# Patient Record
Sex: Female | Born: 1960
Health system: Southern US, Community
[De-identification: ages and names within clinical notes are randomized; demographics above are authoritative.]

## PROBLEM LIST (undated history)

## (undated) DIAGNOSIS — Z9109 Other allergy status, other than to drugs and biological substances: Secondary | ICD-10-CM

## (undated) DIAGNOSIS — R569 Unspecified convulsions: Secondary | ICD-10-CM

## (undated) DIAGNOSIS — Q6689 Other  specified congenital deformities of feet: Secondary | ICD-10-CM

## (undated) DIAGNOSIS — K219 Gastro-esophageal reflux disease without esophagitis: Secondary | ICD-10-CM

## (undated) DIAGNOSIS — Z860101 Personal history of adenomatous and serrated colon polyps: Secondary | ICD-10-CM

## (undated) DIAGNOSIS — E119 Type 2 diabetes mellitus without complications: Secondary | ICD-10-CM

## (undated) DIAGNOSIS — E7800 Pure hypercholesterolemia, unspecified: Secondary | ICD-10-CM

## (undated) DIAGNOSIS — T7840XA Allergy, unspecified, initial encounter: Secondary | ICD-10-CM

## (undated) DIAGNOSIS — R141 Gas pain: Secondary | ICD-10-CM

## (undated) DIAGNOSIS — R011 Cardiac murmur, unspecified: Secondary | ICD-10-CM

## (undated) DIAGNOSIS — R7303 Prediabetes: Secondary | ICD-10-CM

## (undated) DIAGNOSIS — M1990 Unspecified osteoarthritis, unspecified site: Secondary | ICD-10-CM

## (undated) DIAGNOSIS — I10 Essential (primary) hypertension: Secondary | ICD-10-CM

## (undated) DIAGNOSIS — M069 Rheumatoid arthritis, unspecified: Secondary | ICD-10-CM

## (undated) HISTORY — DX: Gastro-esophageal reflux disease without esophagitis: K21.9

## (undated) HISTORY — PX: HERNIA REPAIR: SHX51

## (undated) HISTORY — DX: Pure hypercholesterolemia, unspecified: E78.00

## (undated) HISTORY — DX: Allergy, unspecified, initial encounter: T78.40XA

## (undated) HISTORY — DX: Unspecified osteoarthritis, unspecified site: M19.90

## (undated) HISTORY — DX: Essential (primary) hypertension: I10

## (undated) HISTORY — DX: Cardiac murmur, unspecified: R01.1

## (undated) HISTORY — DX: Other allergy status, other than to drugs and biological substances: Z91.09

---
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
## 1991-10-17 HISTORY — PX: TUBAL LIGATION: SHX77

## 2004-03-04 ENCOUNTER — Ambulatory Visit: Payer: Self-pay | Admitting: Internal Medicine

## 2005-03-11 ENCOUNTER — Ambulatory Visit: Payer: Self-pay | Admitting: Internal Medicine

## 2006-04-13 ENCOUNTER — Ambulatory Visit: Payer: Self-pay | Admitting: Internal Medicine

## 2007-07-13 ENCOUNTER — Ambulatory Visit: Payer: Self-pay | Admitting: Internal Medicine

## 2008-07-16 ENCOUNTER — Ambulatory Visit: Payer: Self-pay | Admitting: Internal Medicine

## 2010-12-10 ENCOUNTER — Ambulatory Visit: Payer: Self-pay | Admitting: Internal Medicine

## 2012-04-18 ENCOUNTER — Ambulatory Visit (INDEPENDENT_AMBULATORY_CARE_PROVIDER_SITE_OTHER): Payer: 59 | Admitting: Internal Medicine

## 2012-04-18 ENCOUNTER — Encounter: Payer: Self-pay | Admitting: Internal Medicine

## 2012-04-18 ENCOUNTER — Other Ambulatory Visit (HOSPITAL_COMMUNITY)
Admission: RE | Admit: 2012-04-18 | Discharge: 2012-04-18 | Disposition: A | Discharge status: Home / Self Care | Payer: 59 | Source: Ambulatory Visit | Attending: Internal Medicine | Admitting: Internal Medicine

## 2012-04-18 VITALS — BP 126/88 | HR 79 | Temp 98.9°F | Ht 65.0 in | Wt 190.8 lb

## 2012-04-18 DIAGNOSIS — E7800 Pure hypercholesterolemia, unspecified: Secondary | ICD-10-CM

## 2012-04-18 DIAGNOSIS — K219 Gastro-esophageal reflux disease without esophagitis: Secondary | ICD-10-CM

## 2012-04-18 DIAGNOSIS — Z01419 Encounter for gynecological examination (general) (routine) without abnormal findings: Secondary | ICD-10-CM | POA: Insufficient documentation

## 2012-04-18 DIAGNOSIS — Z1151 Encounter for screening for human papillomavirus (HPV): Secondary | ICD-10-CM | POA: Insufficient documentation

## 2012-04-18 DIAGNOSIS — Z139 Encounter for screening, unspecified: Secondary | ICD-10-CM

## 2012-04-18 MED ORDER — CLOTRIMAZOLE-BETAMETHASONE 1-0.05 % EX CREA: 1.0000 "application " | TOPICAL_CREAM | Freq: Two times a day (BID) | CUTANEOUS | Status: DC

## 2012-04-18 MED ORDER — ACYCLOVIR 5 % EX OINT: TOPICAL_OINTMENT | CUTANEOUS | Status: DC

## 2012-04-18 NOTE — Assessment & Plan Note (Signed)
Controlled on Prilosec.  Follow.   

## 2012-04-18 NOTE — Assessment & Plan Note (Signed)
Low cholesterol diet and exercise.  Check lipid panel.   

## 2012-04-18 NOTE — Patient Instructions (Addendum)
It was nice seeing you today.  I am glad you have been doing well.  I want you to monitor your blood pressure for me and send in readings.  Let me know if any problems.

## 2012-04-18 NOTE — Progress Notes (Signed)
Subjective:    Patient ID: Wendy Callahan, female    DOB: 08/10/1960, 51 y.o.   MRN: [id number]  HPI 51 year old female with past history of allergies and GERD who comes in today to follow up on these issues as well as for a complete physical exam.  She states she is doing relatively well.  Had strep throat one month ago.  Symptoms have resolved.  She has a different job at [employer].  Not as flexible with her schedule.  Still walking.  Not exercising as much.  She does notice some sob with exertion.  Some intermittent chest tightness which she states may not be related to exertion.  Was questioning if it could have been stress related.  No pain currently.  Feels she is handling stress relatively well.  Periods regular.  LMP 04/13/12.  No bowel change.    Past Medical History  Diagnosis Date  . Environmental allergies   . GERD (gastroesophageal reflux disease)   . Hypercholesterolemia   . Arthritis   . Allergy   . Hypertension     Current Outpatient Prescriptions on File Prior to Visit  Medication Sig Dispense Refill  . fexofenadine (ALLEGRA) 180 MG tablet Take 180 mg by mouth daily.      Marland Kitchen omeprazole (PRILOSEC) 20 MG capsule Take 20 mg by mouth daily.        Review of Systems Patient denies any headache, lightheadedness or dizziness.  No significant sinus or allergy symptoms.  No palpitations.  Does describe the chest tightness - as outlined above.  No increased shortness cough or congestion.  Has noticed some increased sob with exertion.  No nausea or vomiting.  No acid reflux.  No abdominal pain or cramping.  No bowel change, such as diarrhea, constipation, BRBPR or melana.  No urine change.  She states she does not feel as good since she has gained her weight back and is not exercising as much.  She is still walking.         Objective:   Physical Exam Filed Vitals:   04/18/12 1419  BP: 126/88  Pulse: 79  Temp: 98.9 F (37.2 C)   Blood pressure recheck:  31/22  51 year old  female in no acute distress.   HEENT:  Nares- clear.  Oropharynx - without lesions. NECK:  Supple.  Nontender.  No audible bruit.  HEART:  Appears to be regular. LUNGS:  No crackles or wheezing audible.  Respirations even and unlabored.  RADIAL PULSE:  Equal bilaterally.    BREASTS:  No nipple discharge or nipple retraction present.  Could not appreciate any distinct nodules or axillary adenopathy.  ABDOMEN:  Soft, nontender.  Bowel sounds present and normal.  No audible abdominal bruit.  GU:  Normal external genitalia.  Vaginal vault without lesions.  Cervix identified.  Pap performed. Could not appreciate any adnexal masses or tenderness.   RECTAL:  Heme negative.   EXTREMITIES:  No increased edema present.  DP pulses palpable and equal bilaterally.          Assessment & Plan:  CARDIOVASCULAR.  Previous ECHO revealed moderate LVH, EF 60%, mild RV enlargement with no significant valve abnormality.  With the sob with exertion and intermittent chest tightness, EKG obtained and revealed SR with no acute ischemic changes.  Discussed with her regarding obtaining a stress test to confirm no active ischemia.  She declines at this time.  Wants to monitor her symptoms.  Instructed - needs reevaluation if any  symptom change or problems.    PULMONARY.  Sob with exertion as outlined.  No increased cough or congestion.  She feels it is related to not exercising.  Hold on any further w/up at this time.  Follow.    ELEVATED BLOOD PRESSURE.  Blood pressure elevated on my check today.  Have her spot check her pressure and get her back in soon to reassess.  If persistent elevation, will require medication.    FATIGUE.  She is out of her exercise routine.  Check cbc, met c and tsh.    PREVIOUS HEMATURIA.  Check urinalysis.    HEALTH MAINTENANCE.  Physical today.  Schedule mammogram.  Pap at 10/20/10 physical negative with negative HPV.  Check cholesterol.  Due for a screening colonoscopy.  Schedule with GI.

## 2012-05-18 HISTORY — PX: COLONOSCOPY: SHX174

## 2012-06-01 ENCOUNTER — Telehealth: Payer: Self-pay | Admitting: *Deleted

## 2012-06-01 NOTE — Telephone Encounter (Signed)
Called patients with labs results from [facility]. Also mailed new form to patient's home address.

## 2012-06-20 ENCOUNTER — Encounter: Payer: Self-pay | Admitting: Internal Medicine

## 2012-06-22 ENCOUNTER — Encounter: Payer: Self-pay | Admitting: Internal Medicine

## 2012-06-22 DIAGNOSIS — Q6689 Other  specified congenital deformities of feet: Secondary | ICD-10-CM

## 2012-06-22 DIAGNOSIS — M719 Bursopathy, unspecified: Secondary | ICD-10-CM | POA: Insufficient documentation

## 2012-06-22 DIAGNOSIS — M67919 Unspecified disorder of synovium and tendon, unspecified shoulder: Secondary | ICD-10-CM

## 2012-06-22 DIAGNOSIS — Z86010 Personal history of colonic polyps: Secondary | ICD-10-CM

## 2012-06-23 ENCOUNTER — Ambulatory Visit: Payer: 59 | Admitting: Internal Medicine

## 2012-08-04 ENCOUNTER — Ambulatory Visit: Payer: 59 | Admitting: Internal Medicine

## 2012-08-05 ENCOUNTER — Ambulatory Visit: Payer: Self-pay | Admitting: Unknown Physician Specialty

## 2012-08-08 LAB — PATHOLOGY REPORT

## 2012-08-14 DIAGNOSIS — Z860100 Personal history of colon polyps, unspecified: Secondary | ICD-10-CM | POA: Insufficient documentation

## 2012-08-20 ENCOUNTER — Encounter: Payer: Self-pay | Admitting: Internal Medicine

## 2012-08-20 DIAGNOSIS — Z86010 Personal history of colonic polyps: Secondary | ICD-10-CM

## 2012-08-30 ENCOUNTER — Encounter: Payer: Self-pay | Admitting: Internal Medicine

## 2013-05-18 HISTORY — PX: UPPER GI ENDOSCOPY: SHX6162

## 2013-11-06 ENCOUNTER — Ambulatory Visit (INDEPENDENT_AMBULATORY_CARE_PROVIDER_SITE_OTHER): Payer: 59 | Admitting: Internal Medicine

## 2013-11-06 ENCOUNTER — Encounter: Payer: Self-pay | Admitting: Internal Medicine

## 2013-11-06 VITALS — BP 130/100 | HR 70 | Temp 98.3°F | Ht 65.0 in | Wt 299.0 lb

## 2013-11-06 DIAGNOSIS — R140 Abdominal distension (gaseous): Secondary | ICD-10-CM

## 2013-11-06 DIAGNOSIS — R142 Eructation: Secondary | ICD-10-CM

## 2013-11-06 DIAGNOSIS — Z733 Stress, not elsewhere classified: Secondary | ICD-10-CM

## 2013-11-06 DIAGNOSIS — R635 Abnormal weight gain: Secondary | ICD-10-CM

## 2013-11-06 DIAGNOSIS — E669 Obesity, unspecified: Secondary | ICD-10-CM

## 2013-11-06 DIAGNOSIS — F439 Reaction to severe stress, unspecified: Secondary | ICD-10-CM

## 2013-11-06 DIAGNOSIS — R143 Flatulence: Secondary | ICD-10-CM

## 2013-11-06 DIAGNOSIS — Z1239 Encounter for other screening for malignant neoplasm of breast: Secondary | ICD-10-CM

## 2013-11-06 DIAGNOSIS — Z86010 Personal history of colonic polyps: Secondary | ICD-10-CM

## 2013-11-06 DIAGNOSIS — M2550 Pain in unspecified joint: Secondary | ICD-10-CM

## 2013-11-06 DIAGNOSIS — R141 Gas pain: Secondary | ICD-10-CM

## 2013-11-06 DIAGNOSIS — E7800 Pure hypercholesterolemia, unspecified: Secondary | ICD-10-CM

## 2013-11-06 DIAGNOSIS — K219 Gastro-esophageal reflux disease without esophagitis: Secondary | ICD-10-CM

## 2013-11-06 DIAGNOSIS — M545 Low back pain, unspecified: Secondary | ICD-10-CM

## 2013-11-06 DIAGNOSIS — I10 Essential (primary) hypertension: Secondary | ICD-10-CM

## 2013-11-06 MED ORDER — LOSARTAN POTASSIUM 25 MG PO TABS: 25.0000 mg | ORAL_TABLET | Freq: Every day | ORAL | Status: DC

## 2013-11-06 MED ORDER — TRAMADOL HCL 50 MG PO TABS: 50.0000 mg | ORAL_TABLET | Freq: Two times a day (BID) | ORAL | Status: DC | PRN

## 2013-11-06 NOTE — Progress Notes (Signed)
Subjective:    Patient ID: Wendy Callahan, female    DOB: 1961/01/27, 53 y.o.   MRN: [id number]  HPI 53 year old female with past history of allergies and GERD who comes in today as a work in with concerns regarding a possible reaction to [employer].  She has a different job at [employer].  Not as flexible with her schedule.  Sits more.   Not exercising as much.  Increased stress.  Lost her father recently.  Daughter moved out.  Has noticed some increased low back pressure.  Increased knee discomfort.  Feels worsened by increased sitting and inactivity.  She has started taking alleve.  Helps with arthritis.  After starting, she noticed some rectal bleeding.  Also has noticed some increased burping.  Having no rectal bleeding now.  Just had colonoscopy 08/05/12 which revealed one 37m polyp and internal hemorrhoids.  Feels she is handling stress relatively well.  LMP 08/16/13.  Has noticed some change in her periods recently.  Does not have a period every month.  Has gained weight.  Feels bloated.  No nausea or vomiting.  Blood pressure elevated.     Past Medical History  Diagnosis Date  . Environmental allergies   . GERD (gastroesophageal reflux disease)   . Hypercholesterolemia   . Arthritis   . Allergy   . Hypertension     Current Outpatient Prescriptions on File Prior to Visit  Medication Sig Dispense Refill  . acyclovir ointment (ZOVIRAX) 5 % Apply topical as directed  15 g  0  . clotrimazole-betamethasone (LOTRISONE) cream Apply 1 application topically 2 (two) times daily. 7-10 days  45 g  0  . fexofenadine (ALLEGRA) 180 MG tablet Take 180 mg by mouth daily.      .Marland Kitchenomeprazole (PRILOSEC) 20 MG capsule Take 20 mg by mouth daily.       No current facility-administered medications on file prior to visit.    Review of Systems Patient denies any headache, lightheadedness or dizziness.  No significant sinus or allergy symptoms.  No palpitations.  No chest pain.   No increased shortness of breath,  cough or congestion.   No nausea or vomiting.  Increased burping.  Feels bloated and tight.   No abdominal pain.  No bowel change, such as diarrhea, constipation.  Did notice some rectal bleeding previously.  Resolved now.   No urine change.  She states she does not feel as good since she has gained her weight back and is not exercising as much.  She is still walking some.  Increased back pressure and joint pain.  Taking alleve.  Blood pressure elevated.         Objective:   Physical Exam  Filed Vitals:   11/06/13 0837  BP: 130/100  Pulse: 70  Temp: 98.3 F (36.8 C)   Blood pressure recheck:  [id number] 53year old female in no acute distress.   HEENT:  Nares- clear.  Oropharynx - without lesions. NECK:  Supple.  Nontender.  No audible bruit.  HEART:  Appears to be regular. LUNGS:  No crackles or wheezing audible.  Respirations even and unlabored.  RADIAL PULSE:  Equal bilaterally.    ABDOMEN:  Soft, nontender.  Bowel sounds present and normal.  No audible abdominal bruit.   RECTAL:  Heme negative.   EXTREMITIES:  No increased edema present.  DP pulses palpable and equal bilaterally.          Assessment & Plan:  CARDIOVASCULAR.  Previous ECHO  revealed moderate LVH, EF 60%, mild RV enlargement with no significant valve abnormality.  Denies any increased chest pain or sob currently.  Follow.    FATIGUE.  She is out of her exercise routine.  Check cbc, met c and tsh.    PREVIOUS HEMATURIA.  Check urinalysis.    HEALTH MAINTENANCE.  Will need to schedule a physical.  Schedule mammogram.  Pap at 10/20/10 physical negative with negative HPV.  Check cholesterol.  Colonoscopy 08/05/12 revealed one 45m polyp and internal hemorrhoids.    I spent 40 minutes with the patient and more than 50% of the time was spent in consultation regarding the above (specifically discussed back and knee pain, bloating, burping and stress).

## 2013-11-06 NOTE — Progress Notes (Signed)
Pre visit review using our clinic review tool, if applicable. No additional management support is needed unless otherwise documented below in the visit note. 

## 2013-11-12 ENCOUNTER — Encounter: Payer: Self-pay | Admitting: Internal Medicine

## 2013-11-12 DIAGNOSIS — R140 Abdominal distension (gaseous): Secondary | ICD-10-CM | POA: Insufficient documentation

## 2013-11-12 DIAGNOSIS — M545 Low back pain, unspecified: Secondary | ICD-10-CM | POA: Insufficient documentation

## 2013-11-12 DIAGNOSIS — F439 Reaction to severe stress, unspecified: Secondary | ICD-10-CM | POA: Insufficient documentation

## 2013-11-12 DIAGNOSIS — Z6841 Body Mass Index (BMI) 40.0 and over, adult: Secondary | ICD-10-CM | POA: Insufficient documentation

## 2013-11-12 DIAGNOSIS — I10 Essential (primary) hypertension: Secondary | ICD-10-CM | POA: Insufficient documentation

## 2013-11-12 NOTE — Assessment & Plan Note (Signed)
Has been on prilosec for a while.  Has never had EGD.  Now with increased bloating, rectal bleeding and increased burping.  Had a colonoscopy 08/05/12 - internal hemorrhoids and one polyp removed.  Will increase prilosec to bid. Stop alleve and any other antiinflammatories.  Will refer to GI for further evaluation for possible EGD given worsening symptoms and given has never had EGD and requires daily PPI.

## 2013-11-12 NOTE — Assessment & Plan Note (Signed)
Had the rectal bleeding as outlined.  Just had colonoscopy as outlined.  Has resolved now.  May have been hemorrhoidal bleed.  Refer back to GI as outlined.  Follow.

## 2013-11-12 NOTE — Assessment & Plan Note (Signed)
Hopefully she can get back in her exercise routine.  Adjust diet.  Weight loss.

## 2013-11-12 NOTE — Assessment & Plan Note (Signed)
With the increased bloating and burping as outlined.  Refer to GI as outlined. Stop alleve.  Increase prilosec to bid.  Follow.  Get her back in soon to reassess.

## 2013-11-12 NOTE — Assessment & Plan Note (Signed)
Blood pressure elevated.  Start losartan 25mg  q day.  Follow pressures.  Check metabolic panel.  If persistent increase, titrate medication.  Get her back in soon to reassess.

## 2013-11-12 NOTE — Assessment & Plan Note (Signed)
Increased low back pressure.  Having to take alleve.  Will stop alleve.  Ultram as directed.  Check L-S spine xray.

## 2013-11-12 NOTE — Assessment & Plan Note (Signed)
Low cholesterol diet and exercise.  Check lipid panel.   

## 2013-11-12 NOTE — Assessment & Plan Note (Signed)
Feels she is coping relatively well.  Follow.   

## 2013-11-13 ENCOUNTER — Telehealth: Payer: Self-pay | Admitting: Internal Medicine

## 2013-11-13 NOTE — Telephone Encounter (Signed)
LMTCB

## 2013-11-13 NOTE — Telephone Encounter (Signed)
Order faxed to [facility].

## 2013-11-13 NOTE — Telephone Encounter (Signed)
Notify pt that I received her lab results and her pregnancy test was negative.  Her white blood cell count, hemoglobin, thyroid test and inflammation test are within normal limits.  Her sugar is elevated.  I would like to recheck a fasting glucose and a1c within the next week.  One liver test is just slightly elevated.  This can be from fatty liver.  I will recheck this with the sugar.  Gets her labs at [facility].  Can send over rx if pt desires.

## 2013-11-13 NOTE — Telephone Encounter (Signed)
Pt notified & would like lab order faxed to [facility] on [address]. ([facility] placed in your folder for completion)

## 2013-11-13 NOTE — Telephone Encounter (Signed)
Order signed and placed in your basket.

## 2013-12-05 LAB — HEPATIC FUNCTION PANEL
ALT: 17 U/L (ref 7–35)
AST: 20 U/L (ref 13–35)
Alkaline Phosphatase: 95 U/L (ref 25–125)
Bilirubin, Direct: 0.07 mg/dL (ref 0.01–0.4)
Bilirubin, Total: 0.2 mg/dL

## 2013-12-05 LAB — BASIC METABOLIC PANEL
BUN: 10 mg/dL (ref 4–21)
Creatinine: 0.6 mg/dL (ref 0.5–1.1)
Glucose: 128 mg/dL
Potassium: 5 mmol/L (ref 3.4–5.3)
Sodium: 140 mmol/L (ref 137–147)

## 2013-12-05 LAB — HEMOGLOBIN A1C: Hgb A1c MFr Bld: 6.9 % — AB (ref 4.0–6.0)

## 2013-12-06 ENCOUNTER — Encounter: Payer: Self-pay | Admitting: Internal Medicine

## 2013-12-06 ENCOUNTER — Ambulatory Visit (INDEPENDENT_AMBULATORY_CARE_PROVIDER_SITE_OTHER): Payer: 59 | Admitting: Internal Medicine

## 2013-12-06 VITALS — BP 122/80 | HR 75 | Resp 16 | Ht 65.0 in | Wt 303.5 lb

## 2013-12-06 DIAGNOSIS — E1165 Type 2 diabetes mellitus with hyperglycemia: Secondary | ICD-10-CM

## 2013-12-06 DIAGNOSIS — E7800 Pure hypercholesterolemia, unspecified: Secondary | ICD-10-CM

## 2013-12-06 DIAGNOSIS — I10 Essential (primary) hypertension: Secondary | ICD-10-CM

## 2013-12-06 DIAGNOSIS — Z733 Stress, not elsewhere classified: Secondary | ICD-10-CM

## 2013-12-06 DIAGNOSIS — R141 Gas pain: Secondary | ICD-10-CM

## 2013-12-06 DIAGNOSIS — F439 Reaction to severe stress, unspecified: Secondary | ICD-10-CM

## 2013-12-06 DIAGNOSIS — M545 Low back pain, unspecified: Secondary | ICD-10-CM

## 2013-12-06 DIAGNOSIS — R140 Abdominal distension (gaseous): Secondary | ICD-10-CM

## 2013-12-06 DIAGNOSIS — K219 Gastro-esophageal reflux disease without esophagitis: Secondary | ICD-10-CM

## 2013-12-06 DIAGNOSIS — R143 Flatulence: Secondary | ICD-10-CM

## 2013-12-06 DIAGNOSIS — E669 Obesity, unspecified: Secondary | ICD-10-CM

## 2013-12-06 DIAGNOSIS — R142 Eructation: Secondary | ICD-10-CM

## 2013-12-06 DIAGNOSIS — IMO0001 Reserved for inherently not codable concepts without codable children: Secondary | ICD-10-CM

## 2013-12-06 DIAGNOSIS — Z86010 Personal history of colonic polyps: Secondary | ICD-10-CM

## 2013-12-06 NOTE — Progress Notes (Signed)
Pre visit review using our clinic review tool, if applicable. No additional management support is needed unless otherwise documented below in the visit note. 

## 2013-12-07 ENCOUNTER — Encounter: Payer: Self-pay | Admitting: Internal Medicine

## 2013-12-07 ENCOUNTER — Telehealth: Payer: Self-pay | Admitting: *Deleted

## 2013-12-07 DIAGNOSIS — E119 Type 2 diabetes mellitus without complications: Secondary | ICD-10-CM | POA: Insufficient documentation

## 2013-12-07 NOTE — Assessment & Plan Note (Signed)
Is some better.  Follow.

## 2013-12-07 NOTE — Assessment & Plan Note (Signed)
Adjust diet.  Weight loss.  She is walking.  We discussed [facility] referral.

## 2013-12-07 NOTE — Telephone Encounter (Signed)
Pt came by to get a copy of her lab results, we were unable to find them, pt stated that you had them yesterday during her appt.

## 2013-12-07 NOTE — Assessment & Plan Note (Signed)
A1c just checked 6.9.  Discussed at length with her today.  Discussed diet and exercise and lifestyles referral.  She wants to hold on medication.  She was instructed on how to check sugars.  Follow.

## 2013-12-07 NOTE — Assessment & Plan Note (Signed)
Feels she is coping relatively well.  Follow.   

## 2013-12-07 NOTE — Assessment & Plan Note (Signed)
Had the rectal bleeding as outlined.  Just had colonoscopy as outlined.  Has resolved now.  May have been hemorrhoidal bleed.  Refer back to GI as outlined.  Follow.

## 2013-12-07 NOTE — Assessment & Plan Note (Signed)
Blood pressure better on Losartan.  Follow.

## 2013-12-07 NOTE — Assessment & Plan Note (Signed)
Has been on prilosec for a while.  Has never had EGD.  Now with increased bloating, previous rectal bleeding and increased burping.  Had a colonoscopy 08/05/12 - internal hemorrhoids and one polyp removed.   Prilosec was increased to bid. Stopped alleve.   Has never had EGD and requires daily PPI.

## 2013-12-07 NOTE — Progress Notes (Signed)
Subjective:    Patient ID: Wendy Callahan, female    DOB: Apr 05, 1961, 53 y.o.   MRN: [id number]  HPI 53 year old female with past history of allergies and GERD who comes in today for a scheduled follow up.  Has been under Increased stress recently.  See last note for details.  Lost her father recently.  Daughter moved out.  Also had noticed some increased low back pressure.  Increased knee discomfort.  Feels worsened by increased sitting and inactivity.  She is off alleve now.  Back is some better.  Has tramadol if needed.   After starting the alleve, she noticed some rectal bleeding.  Having no rectal bleeding now.  Just had colonoscopy 08/05/12 which revealed one 73mm polyp and internal hemorrhoids.  Feels she is handling stress relatively well.  LMP 08/16/13.  Has noticed some change in her periods recently.  Does not have a period every month.  Has gained weight.  Last visit was having increased bloating, etc.  Antiinflammatories were stopped.  prilosec was increased to bid.  Still feels bloated.  No nausea or vomiting.  Tolerating losartan.  We discussed diet and exercise.  She has adjusted her diet.  Is exercising.     Past Medical History  Diagnosis Date  . Environmental allergies   . GERD (gastroesophageal reflux disease)   . Hypercholesterolemia   . Arthritis   . Allergy   . Hypertension     Current Outpatient Prescriptions on File Prior to Visit  Medication Sig Dispense Refill  . acyclovir ointment (ZOVIRAX) 5 % Apply topical as directed  15 g  0  . clotrimazole-betamethasone (LOTRISONE) cream Apply 1 application topically 2 (two) times daily. 7-10 days  45 g  0  . fexofenadine (ALLEGRA) 180 MG tablet Take 180 mg by mouth daily.      Marland Kitchen losartan (COZAAR) 25 MG tablet Take 1 tablet (25 mg total) by mouth daily.  30 tablet  1  . naproxen sodium (ANAPROX) 220 MG tablet Take 220 mg by mouth as needed (arthritis).      Marland Kitchen omeprazole (PRILOSEC) 20 MG capsule Take 20 mg by mouth daily.      .  traMADol (ULTRAM) 50 MG tablet Take 1 tablet (50 mg total) by mouth 2 (two) times daily as needed.  30 tablet  0   No current facility-administered medications on file prior to visit.    Review of Systems Patient denies any headache, lightheadedness or dizziness.  No significant sinus or allergy symptoms.  No palpitations.  No chest pain.   No increased shortness of breath, cough or congestion.   No nausea or vomiting.  Increased burping.  Feels bloated and tight.   No abdominal pain.  No bowel change, such as diarrhea, constipation.  Did notice some rectal bleeding previously.  Resolved now.   No urine change.  She states she does not feel as good since she has gained her weight back.  Has adjusted her diet.  Is exercising.   Increased back pressure and joint pain.  Off alleve now.  Has tramadol if needed.  Tolerating losartan.         Objective:   Physical Exam  Filed Vitals:   12/06/13 1306  BP: 122/80  Pulse: 75  Resp: 16   Blood pressure recheck:  78/62  53 year old female in no acute distress.   HEENT:  Nares- clear.  Oropharynx - without lesions. NECK:  Supple.  Nontender.  No audible bruit.  HEART:  Appears to be regular. LUNGS:  No crackles or wheezing audible.  Respirations even and unlabored.  RADIAL PULSE:  Equal bilaterally.    ABDOMEN:  Soft, nontender.  Bowel sounds present and normal.  No audible abdominal bruit.    EXTREMITIES:  No increased edema present.  DP pulses palpable and equal bilaterally.          Assessment & Plan:  CARDIOVASCULAR.  Previous ECHO revealed moderate LVH, EF 60%, mild RV enlargement with no significant valve abnormality.  Denies any increased chest pain or sob currently.  Follow.    PREVIOUS HEMATURIA.  Check urinalysis.    HEALTH MAINTENANCE.  Will need to schedule a physical.  Mammogram scheduled last visit.   Pap at 10/20/10 physical negative with negative HPV.  Check cholesterol.  Colonoscopy 08/05/12 revealed one 2mm polyp and internal  hemorrhoids.    I spent 25 minutes with the patient and more than 50% of the time was spent in consultation regarding the above.

## 2013-12-07 NOTE — Assessment & Plan Note (Signed)
With the increased bloating and burping as outlined in this note and last note.  We stopped alleve.  Increased prilosec to bid.  She has been on prilosec for a while.  Has never had EGD.  Has appointment with GI tomorrow.

## 2013-12-07 NOTE — Assessment & Plan Note (Signed)
Low cholesterol diet and exercise.  Check lipid panel.   

## 2013-12-08 ENCOUNTER — Other Ambulatory Visit: Payer: Self-pay | Admitting: Internal Medicine

## 2013-12-08 NOTE — Telephone Encounter (Signed)
Copy of lab placed on your desk. Also send pt a copy of Dr Lupita Dawn diet.  I gave her a prescription to have her cholesterol checked.  Tell her she will need to go and have this drawn.  Thanks.

## 2013-12-08 NOTE — Telephone Encounter (Signed)
Sent copy of labs to dr [employer] as requested, notified pt of MD's notes, also mailed copy of labs and diet to pt

## 2013-12-11 ENCOUNTER — Ambulatory Visit: Payer: 59 | Admitting: Internal Medicine

## 2013-12-14 ENCOUNTER — Encounter: Payer: Self-pay | Admitting: Internal Medicine

## 2013-12-15 LAB — LIPID PANEL
Cholesterol: 213 mg/dL — AB (ref 0–200)
HDL: 55 mg/dL (ref 35–70)
LDL Cholesterol: 114 mg/dL
Triglycerides: 219 mg/dL — AB (ref 40–160)

## 2013-12-20 ENCOUNTER — Encounter: Payer: Self-pay | Admitting: Internal Medicine

## 2013-12-20 ENCOUNTER — Other Ambulatory Visit: Payer: Self-pay | Admitting: Internal Medicine

## 2013-12-21 ENCOUNTER — Encounter: Payer: Self-pay | Admitting: Internal Medicine

## 2013-12-22 NOTE — Telephone Encounter (Signed)
See mychart, needing cotinine test, wants faxed to [place], started order, in blue folder. Needs signature and diagnosis code

## 2014-01-09 ENCOUNTER — Encounter: Payer: Self-pay | Admitting: Internal Medicine

## 2014-01-11 ENCOUNTER — Ambulatory Visit: Payer: Self-pay | Admitting: Internal Medicine

## 2014-01-12 NOTE — Telephone Encounter (Signed)
Pt came by office today to complete Health Screening form & pt took form with her & stated that she will fax it herself.

## 2014-01-16 ENCOUNTER — Other Ambulatory Visit: Payer: Self-pay | Admitting: Internal Medicine

## 2014-01-16 ENCOUNTER — Ambulatory Visit: Payer: Self-pay | Admitting: Internal Medicine

## 2014-01-17 ENCOUNTER — Encounter: Payer: Self-pay | Admitting: *Deleted

## 2014-01-23 ENCOUNTER — Ambulatory Visit (INDEPENDENT_AMBULATORY_CARE_PROVIDER_SITE_OTHER): Payer: 59 | Admitting: Internal Medicine

## 2014-01-23 ENCOUNTER — Encounter: Payer: Self-pay | Admitting: Internal Medicine

## 2014-01-23 VITALS — BP 112/80 | HR 73 | Temp 98.3°F | Ht 66.0 in | Wt 298.8 lb

## 2014-01-23 DIAGNOSIS — R143 Flatulence: Secondary | ICD-10-CM

## 2014-01-23 DIAGNOSIS — M545 Low back pain, unspecified: Secondary | ICD-10-CM

## 2014-01-23 DIAGNOSIS — E7800 Pure hypercholesterolemia, unspecified: Secondary | ICD-10-CM

## 2014-01-23 DIAGNOSIS — K219 Gastro-esophageal reflux disease without esophagitis: Secondary | ICD-10-CM

## 2014-01-23 DIAGNOSIS — E1165 Type 2 diabetes mellitus with hyperglycemia: Secondary | ICD-10-CM

## 2014-01-23 DIAGNOSIS — IMO0001 Reserved for inherently not codable concepts without codable children: Secondary | ICD-10-CM

## 2014-01-23 DIAGNOSIS — I10 Essential (primary) hypertension: Secondary | ICD-10-CM

## 2014-01-23 DIAGNOSIS — Z86010 Personal history of colonic polyps: Secondary | ICD-10-CM

## 2014-01-23 DIAGNOSIS — E669 Obesity, unspecified: Secondary | ICD-10-CM

## 2014-01-23 DIAGNOSIS — R141 Gas pain: Secondary | ICD-10-CM

## 2014-01-23 DIAGNOSIS — R142 Eructation: Secondary | ICD-10-CM

## 2014-01-23 DIAGNOSIS — R140 Abdominal distension (gaseous): Secondary | ICD-10-CM

## 2014-01-23 MED ORDER — TRAMADOL HCL 50 MG PO TABS: 50.0000 mg | ORAL_TABLET | Freq: Two times a day (BID) | ORAL | Status: DC | PRN

## 2014-01-23 NOTE — Progress Notes (Signed)
Pre visit review using our clinic review tool, if applicable. No additional management support is needed unless otherwise documented below in the visit note. 

## 2014-01-25 ENCOUNTER — Encounter: Payer: Self-pay | Admitting: Internal Medicine

## 2014-01-25 NOTE — Assessment & Plan Note (Signed)
Low cholesterol diet and exercise.  Follow lipid panel.   

## 2014-01-25 NOTE — Assessment & Plan Note (Signed)
Blood pressure better on Losartan.  Follow.

## 2014-01-25 NOTE — Assessment & Plan Note (Signed)
Just had colonoscopy as outlined.  Just saw GI.

## 2014-01-25 NOTE — Assessment & Plan Note (Signed)
Had a colonoscopy 08/05/12 - internal hemorrhoids and one polyp removed.   Prilosec was increased to bid. Stopped alleve.   Has never had EGD and requires daily PPI.  Symptoms are better.  Saw GI.  They recommended EGD.  She wants to postpone at this time.  Follow.

## 2014-01-25 NOTE — Assessment & Plan Note (Signed)
Is some better.  Follow.

## 2014-01-25 NOTE — Assessment & Plan Note (Signed)
Adjusting diet.  Has lost some weight.  She is walking.  Planning to [facility].

## 2014-01-25 NOTE — Progress Notes (Signed)
Subjective:    Patient ID: Wendy Callahan, female    DOB: 1960/08/22, 53 y.o.   MRN: [id number]  HPI 53 year old female with past history of allergies and GERD who comes in today to follow up on these issues as well as for a complete physical exam.   States overall she feels she is doing better.  She is continuing to exercise.  Has adjusted her diet.  Is losing weight.  Abdomen "not as tight".  Back is some better.  Tramadol if needed.   Saw GI for increased abdominal issues, bowel issues, etc.  Just had colonoscopy 08/05/12 which revealed one 58mm polyp and internal hemorrhoids.  They had discussed pursuing EGD.  She wants to postpone.  Feels she is dong better.  On omeprazole bid.  LMP last week.  Previous period 08/16/13.  etc.  She is adjusting her diet.  Following sugars.  AM sugars averaging 128-155 and pm sugars <110.   No nausea or vomiting.  Tolerating losartan.  Going to [facility].     Past Medical History  Diagnosis Date  . Environmental allergies   . GERD (gastroesophageal reflux disease)   . Hypercholesterolemia   . Arthritis   . Allergy   . Hypertension     Current Outpatient Prescriptions on File Prior to Visit  Medication Sig Dispense Refill  . acyclovir ointment (ZOVIRAX) 5 % Apply topical as directed  15 g  0  . clotrimazole-betamethasone (LOTRISONE) cream Apply 1 application topically 2 (two) times daily. 7-10 days  45 g  0  . fexofenadine (ALLEGRA) 180 MG tablet Take 180 mg by mouth daily.      Marland Kitchen losartan (COZAAR) 25 MG tablet TAKE ONE (1) TABLET EACH DAY  30 tablet  3  . omeprazole (PRILOSEC) 20 MG capsule Take 20 mg by mouth daily.       No current facility-administered medications on file prior to visit.    Review of Systems Patient denies any headache, lightheadedness or dizziness.  No significant sinus or allergy symptoms.  No palpitations.  No chest pain.   No increased shortness of breath, cough or congestion.   No nausea or vomiting.  No abdominal pain.   Bloating and abdominal "tight feeling" improved.  No bowel change, such as diarrhea, constipation.  No urine change.   Has adjusted her diet.  Is exercising.   Has tramadol if needed.  Tolerating losartan.   Period last week as outlined.        Objective:   Physical Exam  Filed Vitals:   01/23/14 1448  BP: 112/80  Pulse: 73  Temp: 98.3 F (36.8 C)   Blood pressure recheck:  30/33  53 year old female in no acute distress.   HEENT:  Nares- clear.  Oropharynx - without lesions. NECK:  Supple.  Nontender.  No audible bruit.  HEART:  Appears to be regular. LUNGS:  No crackles or wheezing audible.  Respirations even and unlabored.  RADIAL PULSE:  Equal bilaterally.    BREASTS:  No nipple discharge or nipple retraction present.  Could not appreciate any distinct nodules or axillary adenopathy.  ABDOMEN:  Soft, nontender.  Bowel sounds present and normal.  No audible abdominal bruit.  GU:  Not performed.    EXTREMITIES:  No increased edema present.  DP pulses palpable and equal bilaterally.      FEET:  No lesions.        Assessment & Plan:  CARDIOVASCULAR.  Previous ECHO revealed moderate LVH,  EF 60%, mild RV enlargement with no significant valve abnormality.  Denies any increased chest pain or sob currently.  Follow.    PREVIOUS HEMATURIA.  Check urinalysis.     HEALTH MAINTENANCE.  Physical today.   Mammogram scheduled previous visit.  Scheduled this visit.   Pap at 04/18/12 negative with negative HPV.  Colonoscopy 08/05/12 revealed one 55mm polyp and internal hemorrhoids.    I spent 25 minutes with the patient and more than 50% of the time was spent in consultation regarding the above.

## 2014-01-25 NOTE — Assessment & Plan Note (Signed)
A1c last checked 6.9.  Have discussed diet and exercise.  Planning to go to [facility].

## 2014-01-25 NOTE — Assessment & Plan Note (Signed)
Improved.  Saw GI.  Wanted to pursue EGD.  She wanted to postpone for now.  Follow.

## 2014-02-19 ENCOUNTER — Ambulatory Visit: Payer: Self-pay | Admitting: Internal Medicine

## 2014-02-22 ENCOUNTER — Ambulatory Visit: Payer: Self-pay | Admitting: Internal Medicine

## 2014-02-22 LAB — HM MAMMOGRAPHY: HM Mammogram: NEGATIVE

## 2014-02-23 ENCOUNTER — Encounter: Payer: Self-pay | Admitting: *Deleted

## 2014-03-05 ENCOUNTER — Ambulatory Visit: Payer: Self-pay | Admitting: Unknown Physician Specialty

## 2014-03-18 ENCOUNTER — Ambulatory Visit: Payer: Self-pay | Admitting: Internal Medicine

## 2014-04-24 ENCOUNTER — Ambulatory Visit: Payer: 59 | Admitting: Internal Medicine

## 2014-05-17 ENCOUNTER — Other Ambulatory Visit: Payer: Self-pay | Admitting: Internal Medicine

## 2014-06-28 ENCOUNTER — Encounter: Payer: Self-pay | Admitting: Internal Medicine

## 2014-06-28 ENCOUNTER — Ambulatory Visit (INDEPENDENT_AMBULATORY_CARE_PROVIDER_SITE_OTHER): Payer: 59 | Admitting: Internal Medicine

## 2014-06-28 VITALS — BP 122/80 | HR 70 | Temp 98.2°F | Ht 66.0 in | Wt 301.5 lb

## 2014-06-28 DIAGNOSIS — E669 Obesity, unspecified: Secondary | ICD-10-CM

## 2014-06-28 DIAGNOSIS — Z860100 Personal history of colon polyps, unspecified: Secondary | ICD-10-CM

## 2014-06-28 DIAGNOSIS — Z0000 Encounter for general adult medical examination without abnormal findings: Secondary | ICD-10-CM

## 2014-06-28 DIAGNOSIS — Z23 Encounter for immunization: Secondary | ICD-10-CM

## 2014-06-28 DIAGNOSIS — F439 Reaction to severe stress, unspecified: Secondary | ICD-10-CM

## 2014-06-28 DIAGNOSIS — K219 Gastro-esophageal reflux disease without esophagitis: Secondary | ICD-10-CM

## 2014-06-28 DIAGNOSIS — I10 Essential (primary) hypertension: Secondary | ICD-10-CM

## 2014-06-28 DIAGNOSIS — E7800 Pure hypercholesterolemia, unspecified: Secondary | ICD-10-CM

## 2014-06-28 DIAGNOSIS — E119 Type 2 diabetes mellitus without complications: Secondary | ICD-10-CM

## 2014-06-28 DIAGNOSIS — R140 Abdominal distension (gaseous): Secondary | ICD-10-CM

## 2014-06-28 DIAGNOSIS — Z658 Other specified problems related to psychosocial circumstances: Secondary | ICD-10-CM

## 2014-06-28 MED ORDER — AMOXICILLIN 875 MG PO TABS: 875.0000 mg | ORAL_TABLET | Freq: Two times a day (BID) | ORAL | Status: DC

## 2014-06-28 NOTE — Progress Notes (Signed)
Patient ID: Wendy Callahan, female   DOB: 03/23/1961, 54 y.o.   MRN: [id number]   Subjective:    Patient ID: Wendy Callahan, female    DOB: 1960/11/28, 54 y.o.   MRN: [id number]  HPI  Patient here for a scheduled follow up.   States she is doing well.  On prilosec daily.  No acid reflux.  No trouble swallowing.  No bloating.  She has started back exercising.  Walking on a treadmill.  No cardiac symptoms with increased activity or exertion.  States her sugars are averaging 85-110 in the am and pm sugars 120s.  Highest reading 170.  Saw GI.  Had EGD.  Handling stress well.  Overall feels better.     Past Medical History  Diagnosis Date  . Environmental allergies   . GERD (gastroesophageal reflux disease)   . Hypercholesterolemia   . Arthritis   . Allergy   . Hypertension     Current Outpatient Prescriptions on File Prior to Visit  Medication Sig Dispense Refill  . Acetaminophen (TYLENOL ARTHRITIS PAIN PO) Take by mouth 2 (two) times daily.    Marland Kitchen acyclovir ointment (ZOVIRAX) 5 % Apply topical as directed 15 g 0  . clotrimazole-betamethasone (LOTRISONE) cream Apply 1 application topically 2 (two) times daily. 7-10 days 45 g 0  . fexofenadine (ALLEGRA) 180 MG tablet Take 180 mg by mouth daily.    Marland Kitchen losartan (COZAAR) 25 MG tablet TAKE ONE (1) TABLET EACH DAY 30 tablet 5  . omeprazole (PRILOSEC) 20 MG capsule Take 20 mg by mouth daily.    . traMADol (ULTRAM) 50 MG tablet Take 1 tablet (50 mg total) by mouth 2 (two) times daily as needed. (Patient not taking: Reported on 06/28/2014) 40 tablet 0   No current facility-administered medications on file prior to visit.    Review of Systems  Constitutional: Negative for appetite change and unexpected weight change.  HENT: Negative for congestion and sinus pressure.   Respiratory: Negative for cough, chest tightness and shortness of breath.   Cardiovascular: Negative for chest pain, palpitations and leg swelling.  Gastrointestinal: Negative for  nausea, vomiting, abdominal pain, diarrhea and constipation.  Musculoskeletal: Negative for back pain and joint swelling.  Skin: Negative for color change and rash.  Neurological: Negative for dizziness, light-headedness and headaches.       Objective:    Physical Exam  HENT:  Nose: Nose normal.  Mouth/Throat: Oropharynx is clear and moist.  Neck: Neck supple. No thyromegaly present.  Cardiovascular: Normal rate and regular rhythm.   Pulmonary/Chest: Breath sounds normal. No respiratory distress. She has no wheezes.  Abdominal: Soft. Bowel sounds are normal. There is no tenderness.  Musculoskeletal: She exhibits no edema or tenderness.  Lymphadenopathy:    She has no cervical adenopathy.  Skin: No rash noted. No erythema.    BP 122/80 mmHg  Pulse 70  Temp(Src) 98.2 F (36.8 C) (Oral)  Ht '5\' 6"'  (1.676 m)  Wt 301 lb 8 oz (136.76 kg)  BMI 48.69 kg/m2  SpO2 95% Wt Readings from Last 3 Encounters:  06/28/14 301 lb 8 oz (136.76 kg)  01/23/14 298 lb 12 oz (135.512 kg)  12/06/13 303 lb 8 oz (137.667 kg)     Lab Results  Component Value Date   CHOL 213* 12/15/2013   TRIG 219* 12/15/2013   HDL 55 12/15/2013   LDLCALC 114 12/15/2013   ALT 17 12/05/2013   AST 20 12/05/2013   NA 140 12/05/2013   K 5.0  12/05/2013   CREATININE 0.6 12/05/2013   BUN 10 12/05/2013   HGBA1C 6.9* 12/05/2013       Assessment & Plan:   Problem List Items Addressed This Visit    Bloating    Resolved.        Diabetes mellitus    Low carb diet and exercise.  Follow sugars.  Follow met b and a1c.        Essential hypertension, benign    Blood pressure is doing well.  Same medication regimen.  Follow pressures.  Follow met b.       GERD (gastroesophageal reflux disease)    Just had EGD 03/05/14 - normal esophagus, hiatal hernia and gastritis.  Currently doing well.  On omeprazole.  No swallowing problems.  Follow.        Health care maintenance    Physical 01/23/14.  Mammogram 02/22/14 -  Birads I.  PAP 04/18/12 negative with negative HPV.  Colonoscopy as outlined.        History of colonic polyps    Colonoscopy 08/05/12 as outlined in overview.  Recommended f/u colononscopy in 07/2022.        Hypercholesterolemia    Low cholesterol diet and exercise.  She is exercising.  Follow lipid panel.        Obesity    Diet and exercise.  Follow weight.        Stress    Feels she is handling stress well.  Follow.         Other Visit Diagnoses    Encounter for immunization    -  Primary      I spent 25 minutes with the patient and more than 50% of the time was spent in consultation regarding the above.     Einar Pheasant, MD

## 2014-06-28 NOTE — Progress Notes (Signed)
Pre visit review using our clinic review tool, if applicable. No additional management support is needed unless otherwise documented below in the visit note. 

## 2014-06-28 NOTE — Patient Instructions (Signed)
mucinex in the am and robitussin in the evening.

## 2014-07-01 ENCOUNTER — Encounter: Payer: Self-pay | Admitting: Internal Medicine

## 2014-07-01 DIAGNOSIS — Z0000 Encounter for general adult medical examination without abnormal findings: Secondary | ICD-10-CM | POA: Insufficient documentation

## 2014-07-01 NOTE — Assessment & Plan Note (Signed)
Diet and exercise.  Follow weight.

## 2014-07-01 NOTE — Assessment & Plan Note (Signed)
Low cholesterol diet and exercise.  She is exercising.  Follow lipid panel.

## 2014-07-01 NOTE — Assessment & Plan Note (Signed)
Physical 01/23/14.  Mammogram 02/22/14 - Birads I.  PAP 04/18/12 negative with negative HPV.  Colonoscopy as outlined.

## 2014-07-01 NOTE — Assessment & Plan Note (Signed)
Low carb diet and exercise.  Follow sugars.  Follow met b and a1c.  

## 2014-07-01 NOTE — Assessment & Plan Note (Signed)
Blood pressure is doing well.  Same medication regimen.  Follow pressures.  Follow met b.

## 2014-07-01 NOTE — Assessment & Plan Note (Signed)
Resolved

## 2014-07-01 NOTE — Assessment & Plan Note (Signed)
Just had EGD 03/05/14 - normal esophagus, hiatal hernia and gastritis.  Currently doing well.  On omeprazole.  No swallowing problems.  Follow.

## 2014-07-01 NOTE — Assessment & Plan Note (Signed)
Colonoscopy 08/05/12 as outlined in overview.  Recommended f/u colononscopy in 07/2022.

## 2014-07-01 NOTE — Assessment & Plan Note (Signed)
Feels she is handling stress well.  Follow.

## 2014-09-10 LAB — SURGICAL PATHOLOGY

## 2014-10-29 ENCOUNTER — Ambulatory Visit: Payer: 59 | Admitting: Internal Medicine

## 2014-11-21 ENCOUNTER — Other Ambulatory Visit: Payer: Self-pay | Admitting: Internal Medicine

## 2015-02-19 ENCOUNTER — Telehealth: Payer: Self-pay | Admitting: Internal Medicine

## 2015-02-19 ENCOUNTER — Other Ambulatory Visit: Payer: Self-pay

## 2015-02-19 NOTE — Telephone Encounter (Signed)
°  Pt send a Mychart message stating she needs to have appeal paperwork for 2017 health insurance to be processed.   Due back to [facility] by 03/18/15. And Need to discuss an increased issue I am having with my feet.  Believe heel spurs and/or planters fasciitis may be going on.  Some days, especially when I have exercised (walked ~2 miles) during the day, I can hardly walk at the days end. Pt states. I checked the schedule and did not see any appointment time frame available. Can you ask Dr Nicki Reaper where to schedule pt due to her needing paperwork filled out? Does her paperwork require her to be seen? If not I can inform her to drop off the paperwork and schedule her for the rest of the issues that needs to be addressed. Thank You!

## 2015-02-19 NOTE — Telephone Encounter (Signed)
Left a message to return my call.

## 2015-02-19 NOTE — Telephone Encounter (Signed)
Last appointment was 06/28/14, please advise for appointment date and time, as paperwork needs to be done by the end of October.

## 2015-02-19 NOTE — Telephone Encounter (Signed)
Scheduled patient on 10/11.

## 2015-02-19 NOTE — Telephone Encounter (Signed)
I can see her at 12:00 on 02/26/15.

## 2015-02-26 ENCOUNTER — Ambulatory Visit (INDEPENDENT_AMBULATORY_CARE_PROVIDER_SITE_OTHER): Payer: 59 | Admitting: Internal Medicine

## 2015-02-26 ENCOUNTER — Encounter: Payer: Self-pay | Admitting: Internal Medicine

## 2015-02-26 ENCOUNTER — Encounter: Payer: Self-pay | Admitting: *Deleted

## 2015-02-26 VITALS — Ht 66.0 in

## 2015-02-26 DIAGNOSIS — I10 Essential (primary) hypertension: Secondary | ICD-10-CM

## 2015-02-26 DIAGNOSIS — K219 Gastro-esophageal reflux disease without esophagitis: Secondary | ICD-10-CM

## 2015-02-26 DIAGNOSIS — M79672 Pain in left foot: Secondary | ICD-10-CM

## 2015-02-26 DIAGNOSIS — Z86010 Personal history of colonic polyps: Secondary | ICD-10-CM

## 2015-02-26 DIAGNOSIS — M79671 Pain in right foot: Secondary | ICD-10-CM

## 2015-02-26 DIAGNOSIS — E119 Type 2 diabetes mellitus without complications: Secondary | ICD-10-CM | POA: Diagnosis not present

## 2015-02-26 DIAGNOSIS — E669 Obesity, unspecified: Secondary | ICD-10-CM

## 2015-02-26 DIAGNOSIS — Z23 Encounter for immunization: Secondary | ICD-10-CM

## 2015-02-26 DIAGNOSIS — Z1239 Encounter for other screening for malignant neoplasm of breast: Secondary | ICD-10-CM | POA: Diagnosis not present

## 2015-02-26 DIAGNOSIS — E7800 Pure hypercholesterolemia, unspecified: Secondary | ICD-10-CM

## 2015-02-26 NOTE — Progress Notes (Signed)
Patient ID: MELANNY WIRE, female   DOB: 03-25-1961, 54 y.o.   MRN: [id number]   Subjective:    Patient ID: KEVONA LUPINACCI, female    DOB: [birth year], 54 y.o.   MRN: [id number]  HPI  Patient with past history of hypertension, GERD, diabetes and hypercholesterolemia who comes in today to follow up on these issues.  She has not been exercising.  Bilateral heel pain limiting her.  No cardiac symptoms with increased activity or exertion.  No sob.  Blood pressure elevated.  Has been off losartan.  No acid reflux.  No abdominal pain or cramping.  Bowels stable.     Past Medical History  Diagnosis Date  . Environmental allergies   . GERD (gastroesophageal reflux disease)   . Hypercholesterolemia   . Arthritis   . Allergy   . Hypertension    Past Surgical History  Procedure Laterality Date  . Tubal ligation  10/1991  . Bladder surgery  1973   Family History  Problem Relation Age of Onset  . Adopted: Yes  . Congenital heart disease Son     Tetrology of fallot   Social History   Social History  . Marital Status: Married    Spouse Name: N/A  . Number of Children: 2  . Years of Education: N/A   Occupational History  . [employer]   Social History Main Topics  . Smoking status: Never Smoker   . Smokeless tobacco: Never Used  . Alcohol Use: 0.0 oz/week    0 Standard drinks or equivalent per week     Comment: occasional  . Drug Use: No  . Sexual Activity: Not Asked   Other Topics Concern  . None   Social History Narrative    Outpatient Encounter Prescriptions as of 02/26/2015  Medication Sig  . Acetaminophen (TYLENOL ARTHRITIS PAIN PO) Take by mouth 2 (two) times daily.  Marland Kitchen acyclovir ointment (ZOVIRAX) 5 % Apply topical as directed  . clotrimazole-betamethasone (LOTRISONE) cream Apply 1 application topically 2 (two) times daily. 7-10 days  . fexofenadine (ALLEGRA) 180 MG tablet Take 180 mg by mouth daily.  Marland Kitchen losartan (COZAAR)  25 MG tablet TAKE ONE (1) TABLET EACH DAY  . omeprazole (PRILOSEC) 20 MG capsule Take 20 mg by mouth daily.  . [DISCONTINUED] amoxicillin (AMOXIL) 875 MG tablet Take 1 tablet (875 mg total) by mouth 2 (two) times daily.  . [DISCONTINUED] traMADol (ULTRAM) 50 MG tablet Take 1 tablet (50 mg total) by mouth 2 (two) times daily as needed. (Patient not taking: Reported on 06/28/2014)   No facility-administered encounter medications on file as of 02/26/2015.    Review of Systems  Constitutional: Negative for appetite change and unexpected weight change.  HENT: Negative for congestion and sinus pressure.   Eyes: Negative for discharge and visual disturbance.  Respiratory: Negative for cough, chest tightness and shortness of breath.   Cardiovascular: Negative for chest pain, palpitations and leg swelling.  Gastrointestinal: Negative for nausea, vomiting, abdominal pain and diarrhea.  Genitourinary: Negative for dysuria and difficulty urinating.  Musculoskeletal: Negative for back pain.       Bilateral heel pain.    Skin: Negative for color change and rash.  Neurological: Negative for dizziness, light-headedness and headaches.  Psychiatric/Behavioral: Negative for dysphoric mood and agitation.       Objective:     Blood pressure rechecked:  148/92  Physical Exam  Constitutional: She appears well-developed and well-nourished. No distress.  HENT:  Nose: Nose normal.  Mouth/Throat: Oropharynx is clear and moist.  Eyes: Conjunctivae are normal. Right eye exhibits no discharge. Left eye exhibits no discharge.  Neck: Neck supple. No thyromegaly present.  Cardiovascular: Normal rate and regular rhythm.   Pulmonary/Chest: Breath sounds normal. No respiratory distress. She has no wheezes.  Abdominal: Soft. Bowel sounds are normal. There is no tenderness.  Musculoskeletal: She exhibits no edema or tenderness.  Lymphadenopathy:    She has no cervical adenopathy.  Skin: No rash noted. No erythema.   Psychiatric: She has a normal mood and affect. Her behavior is normal.    Ht _0  (1.676 m) Wt Readings from Last 3 Encounters:  06/28/14 301 lb 8 oz (136.76 kg)  01/23/14 298 lb 12 oz (135.512 kg)  12/06/13 303 lb 8 oz (137.667 kg)     Lab Results  Component Value Date   CHOL 213* 12/15/2013   TRIG 219* 12/15/2013   HDL 55 12/15/2013   LDLCALC 114 12/15/2013   ALT 17 12/05/2013   AST 20 12/05/2013   NA 140 12/05/2013   K 5.0 12/05/2013   CREATININE 0.6 12/05/2013   BUN 10 12/05/2013   HGBA1C 6.9* 12/05/2013       Assessment & Plan:   Problem List Items Addressed This Visit    Diabetes mellitus ([place])    Low carb diet and exercise.  Follow met b and a1c.  Discussed diet and exercise today.   Lab Results  Component Value Date   HGBA1C 6.9* 12/05/2013        Essential hypertension, benign    Blood pressure elevated.  Off her medication.  Restart.  Follow pressures.  Get her back in soon to reassess.        GERD (gastroesophageal reflux disease)    EGD 03/05/14 - normal esophagus, hiatal hernia and gastritis.  On omeprazole.  Doing well.  Follow.        Heel pain, bilateral    Has limited her exercise.  Takes tylenol and gentle use of alleve.  May need f/u with podiatry.  Will notify me if agreeable.       History of colonic polyps    Colonoscopy 08/05/12 - as outlined in overview.  Recommended f/u colonoscopy in 07/2022.        Hypercholesterolemia    Low cholesterol diet and exercise.  Follow lipid panel.  Has been limited on her exercise by her heel pain.  Follow.       Obesity    Diet and exercise.  Follow.         Other Visit Diagnoses    Screening breast examination    -  Primary    Relevant Orders    MM DIGITAL SCREENING BILATERAL    Encounter for immunization            Einar Pheasant, MD

## 2015-02-26 NOTE — Progress Notes (Signed)
Pre-visit discussion using our clinic review tool. No additional management support is needed unless otherwise documented below in the visit note.  

## 2015-02-26 NOTE — Patient Instructions (Signed)

## 2015-03-04 ENCOUNTER — Encounter: Payer: Self-pay | Admitting: Internal Medicine

## 2015-03-04 DIAGNOSIS — M79672 Pain in left foot: Secondary | ICD-10-CM

## 2015-03-04 DIAGNOSIS — M79671 Pain in right foot: Secondary | ICD-10-CM | POA: Insufficient documentation

## 2015-03-04 NOTE — Assessment & Plan Note (Signed)
Low carb diet and exercise.  Follow met b and a1c.  Discussed diet and exercise today.   Lab Results  Component Value Date   HGBA1C 6.9* 12/05/2013

## 2015-03-04 NOTE — Assessment & Plan Note (Signed)
EGD 03/05/14 - normal esophagus, hiatal hernia and gastritis.  On omeprazole.  Doing well.  Follow.

## 2015-03-04 NOTE — Assessment & Plan Note (Signed)
Low cholesterol diet and exercise.  Follow lipid panel.  Has been limited on her exercise by her heel pain.  Follow.

## 2015-03-04 NOTE — Assessment & Plan Note (Signed)
Diet and exercise.  Follow.  

## 2015-03-04 NOTE — Assessment & Plan Note (Signed)
Blood pressure elevated.  Off her medication.  Restart.  Follow pressures.  Get her back in soon to reassess.

## 2015-03-04 NOTE — Assessment & Plan Note (Addendum)
Has limited her exercise.  Takes tylenol and gentle use of alleve.  May need f/u with podiatry.  Will notify me if agreeable.

## 2015-03-04 NOTE — Assessment & Plan Note (Signed)
Colonoscopy 08/05/12 - as outlined in overview.  Recommended f/u colonoscopy in 07/2022.

## 2015-03-05 ENCOUNTER — Encounter: Payer: Self-pay | Admitting: Internal Medicine

## 2015-03-05 ENCOUNTER — Telehealth: Payer: Self-pay

## 2015-03-05 ENCOUNTER — Ambulatory Visit (INDEPENDENT_AMBULATORY_CARE_PROVIDER_SITE_OTHER): Payer: 59 | Admitting: Internal Medicine

## 2015-03-05 VITALS — BP 127/79 | HR 94 | Temp 98.5°F | Resp 20 | Ht 66.0 in | Wt 307.0 lb

## 2015-03-05 DIAGNOSIS — I10 Essential (primary) hypertension: Secondary | ICD-10-CM

## 2015-03-05 DIAGNOSIS — R102 Pelvic and perineal pain: Secondary | ICD-10-CM | POA: Diagnosis not present

## 2015-03-05 MED ORDER — AMOXICILLIN-POT CLAVULANATE 875-125 MG PO TABS: 1.0000 | ORAL_TABLET | Freq: Two times a day (BID) | ORAL | Status: DC

## 2015-03-05 NOTE — Telephone Encounter (Signed)
Called to see if she wanted to be seen today at 330

## 2015-03-05 NOTE — Progress Notes (Signed)
Pre-visit discussion using our clinic review tool. No additional management support is needed unless otherwise documented below in the visit note.  

## 2015-03-06 ENCOUNTER — Encounter: Payer: Self-pay | Admitting: Internal Medicine

## 2015-03-06 DIAGNOSIS — R102 Pelvic and perineal pain: Secondary | ICD-10-CM | POA: Insufficient documentation

## 2015-03-06 NOTE — Assessment & Plan Note (Signed)
Blood pressure controlled today.  Follow.  Same medication regimen.

## 2015-03-06 NOTE — Progress Notes (Signed)
Patient ID: Wendy Callahan, female   DOB: 1960/07/09, 54 y.o.   MRN: [id number]   Subjective:    Patient ID: Wendy Callahan, female    DOB: 20-Dec-1960, 54 y.o.   MRN: [id number]  HPI  Patient here as a work in with concerns regarding increased pain and "swelling" in her vaginal area.  Actually called in with concerns regarding a possible hemorrhoid.  On questioning her, she states symptoms started five days ago.  Noticed some increased tenderness.  Gradually worsened.  Felt hot and cold yesterday.  No actual fever.  Increased pain.  Unable to sit in certain positions.  Feels like being pinched at times.  Bowels are moving normal.  No straining.  Some minimal blood on tissue when wipes.  Just feels uncomfortable.  No significant abdominal pain to palpation.     Past Medical History  Diagnosis Date  . Environmental allergies   . GERD (gastroesophageal reflux disease)   . Hypercholesterolemia   . Arthritis   . Allergy   . Hypertension    Past Surgical History  Procedure Laterality Date  . Tubal ligation  10/1991  . Bladder surgery  1973   Family History  Problem Relation Age of Onset  . Adopted: Yes  . Congenital heart disease Son     Tetrology of fallot   Social History   Social History  . Marital Status: Married    Spouse Name: N/A  . Number of Children: 2  . Years of Education: N/A   Occupational History  . [place]   Social History Main Topics  . Smoking status: Never Smoker   . Smokeless tobacco: Never Used  . Alcohol Use: 0.0 oz/week    0 Standard drinks or equivalent per week     Comment: occasional  . Drug Use: No  . Sexual Activity: Not Asked   Other Topics Concern  . None   Social History Narrative    Outpatient Encounter Prescriptions as of 03/05/2015  Medication Sig  . Acetaminophen (TYLENOL ARTHRITIS PAIN PO) Take by mouth 2 (two) times daily.  Marland Kitchen acyclovir ointment (ZOVIRAX) 5 % Apply topical as  directed  . clotrimazole-betamethasone (LOTRISONE) cream Apply 1 application topically 2 (two) times daily. 7-10 days  . fexofenadine (ALLEGRA) 180 MG tablet Take 180 mg by mouth daily.  Marland Kitchen losartan (COZAAR) 25 MG tablet TAKE ONE (1) TABLET EACH DAY  . omeprazole (PRILOSEC) 20 MG capsule Take 20 mg by mouth daily.  Marland Kitchen amoxicillin-clavulanate (AUGMENTIN) 875-125 MG tablet Take 1 tablet by mouth 2 (two) times daily.   No facility-administered encounter medications on file as of 03/05/2015.    Review of Systems  Constitutional: Negative for unexpected weight change.       Feels hot and cold.  Is eating.    HENT: Negative for congestion and sore throat.   Respiratory: Negative for cough and shortness of breath.   Cardiovascular: Negative for chest pain and leg swelling.  Gastrointestinal: Negative for vomiting, abdominal pain and diarrhea.  Genitourinary: Negative for dysuria and difficulty urinating.       Increased pain and "swelling" - vaginal area.   Musculoskeletal: Negative for back pain and joint swelling.  Skin: Negative for color change and rash.  Neurological: Negative for dizziness and headaches.  Psychiatric/Behavioral: Negative for dysphoric mood and agitation.       Objective:    Physical Exam  HENT:  Nose: Nose normal.  Mouth/Throat: Oropharynx is  clear and moist.  Neck: Neck supple.  Cardiovascular: Normal rate and regular rhythm.   Pulmonary/Chest: Breath sounds normal. No respiratory distress. She has no wheezes.  Abdominal: Soft. Bowel sounds are normal. There is no tenderness.  Genitourinary:  GU exam reveals no rash - peri vaginal area.  Increased swelling - vaginal tissue.  Increased erythema.  Increased pain to palpation.  Some question of drainage.  No cyst noted.  Cervix does not appear to be inflamed.  No pustular lesions.    Musculoskeletal: She exhibits no edema or tenderness.  Lymphadenopathy:    She has no cervical adenopathy.  Skin: Skin is dry. No  rash noted.  Psychiatric: She has a normal mood and affect. Her behavior is normal.    BP 127/79 mmHg  Pulse 94  Temp(Src) 98.5 F (36.9 C) (Oral)  Resp 20  Ht 5\' 6"  (1.676 m)  Wt 307 lb (139.254 kg)  BMI 49.57 kg/m2  SpO2 97% Wt Readings from Last 3 Encounters:  03/05/15 307 lb (139.254 kg)  06/28/14 301 lb 8 oz (136.76 kg)  01/23/14 298 lb 12 oz (135.512 kg)     Lab Results  Component Value Date   CHOL 213* 12/15/2013   TRIG 219* 12/15/2013   HDL 55 12/15/2013   LDLCALC 114 12/15/2013   ALT 17 12/05/2013   AST 20 12/05/2013   NA 140 12/05/2013   K 5.0 12/05/2013   CREATININE 0.6 12/05/2013   BUN 10 12/05/2013   HGBA1C 6.9* 12/05/2013       Assessment & Plan:   Problem List Items Addressed This Visit    Essential hypertension, benign    Blood pressure controlled today.  Follow.  Same medication regimen.       Vaginal pain - Primary    Vaginal pain and swelling as outlined.  Some increased erythema and possible drainage.  No cyst or actual abscess identified.  Concern over soft tissue infection.  Will place on augmentin.  No herpetic lesions present.  Significant increased pain.  Will have gyn evaluate asap.  Pt comfortable with plan.  Keep me posted.        Relevant Orders   Ambulatory referral to Gynecology       Einar Pheasant, MD

## 2015-03-06 NOTE — Assessment & Plan Note (Signed)
Vaginal pain and swelling as outlined.  Some increased erythema and possible drainage.  No cyst or actual abscess identified.  Concern over soft tissue infection.  Will place on augmentin.  No herpetic lesions present.  Significant increased pain.  Will have gyn evaluate asap.  Pt comfortable with plan.  Keep me posted.

## 2015-03-07 ENCOUNTER — Encounter: Payer: Self-pay | Admitting: Obstetrics and Gynecology

## 2015-03-12 ENCOUNTER — Ambulatory Visit: Payer: 59 | Attending: Internal Medicine

## 2015-04-15 ENCOUNTER — Other Ambulatory Visit: Payer: Self-pay | Admitting: Internal Medicine

## 2015-04-15 ENCOUNTER — Encounter: Payer: Self-pay | Admitting: Internal Medicine

## 2015-04-15 DIAGNOSIS — M79673 Pain in unspecified foot: Secondary | ICD-10-CM

## 2015-04-15 NOTE — Progress Notes (Signed)
Order placed for podiatry referral.   

## 2015-04-16 ENCOUNTER — Ambulatory Visit: Payer: 59 | Admitting: Internal Medicine

## 2015-05-23 ENCOUNTER — Other Ambulatory Visit: Payer: Self-pay | Admitting: Internal Medicine

## 2015-06-14 ENCOUNTER — Encounter: Payer: Self-pay | Admitting: Internal Medicine

## 2015-06-14 ENCOUNTER — Ambulatory Visit (INDEPENDENT_AMBULATORY_CARE_PROVIDER_SITE_OTHER): Payer: 59 | Admitting: Internal Medicine

## 2015-06-14 VITALS — BP 120/82 | HR 71 | Temp 98.2°F | Resp 18 | Ht 66.0 in | Wt 308.5 lb

## 2015-06-14 DIAGNOSIS — E669 Obesity, unspecified: Secondary | ICD-10-CM

## 2015-06-14 DIAGNOSIS — Z86010 Personal history of colonic polyps: Secondary | ICD-10-CM

## 2015-06-14 DIAGNOSIS — M79672 Pain in left foot: Secondary | ICD-10-CM

## 2015-06-14 DIAGNOSIS — F439 Reaction to severe stress, unspecified: Secondary | ICD-10-CM

## 2015-06-14 DIAGNOSIS — Z23 Encounter for immunization: Secondary | ICD-10-CM | POA: Diagnosis not present

## 2015-06-14 DIAGNOSIS — K219 Gastro-esophageal reflux disease without esophagitis: Secondary | ICD-10-CM | POA: Diagnosis not present

## 2015-06-14 DIAGNOSIS — J019 Acute sinusitis, unspecified: Secondary | ICD-10-CM

## 2015-06-14 DIAGNOSIS — M79671 Pain in right foot: Secondary | ICD-10-CM

## 2015-06-14 DIAGNOSIS — Z658 Other specified problems related to psychosocial circumstances: Secondary | ICD-10-CM

## 2015-06-14 DIAGNOSIS — E119 Type 2 diabetes mellitus without complications: Secondary | ICD-10-CM

## 2015-06-14 DIAGNOSIS — I10 Essential (primary) hypertension: Secondary | ICD-10-CM

## 2015-06-14 DIAGNOSIS — E7800 Pure hypercholesterolemia, unspecified: Secondary | ICD-10-CM

## 2015-06-14 MED ORDER — CEFUROXIME AXETIL 250 MG PO TABS: 250.0000 mg | ORAL_TABLET | Freq: Two times a day (BID) | ORAL | Status: DC

## 2015-06-14 NOTE — Progress Notes (Signed)
Pre-visit discussion using our clinic review tool. No additional management support is needed unless otherwise documented below in the visit note.  

## 2015-06-14 NOTE — Patient Instructions (Signed)
Saline nasal spray - flush nose at least 2-3x/day  nasacort nasal spray - 2 sprays each nostril one time per day.  Do this in the evening.    Mucinex in the am and robitussin in the pm.

## 2015-06-14 NOTE — Progress Notes (Signed)
Patient ID: Wendy Callahan, female   DOB: Jan 21, 1961, 55 y.o.   MRN: [id number]   Subjective:    Patient ID: Wendy Callahan, female    DOB: Jan 09, 1961, 55 y.o.   MRN: [id number]  HPI  Patient with past history of GERD, hypercholesterolemia, hypertension and allergies.  She comes in today to follow up on these issues.  She was having foot issues.  Is some better.  Had placed order for referral to podiatry.  No appt made.  Discussed appt with her today.  Wants to hold on scheduling at this time.  Does report increased head congestion.  Yellow mucus production.  Some cough.  Has not moved into her chest.  Increased sinus pressure and head feels full.  No sob.  No acid reflux.  No abdominal pain or cramping.  Bowels stable.  Discussed diet and exercise.  For the last four weeks, she has decreased her carb intake. Discussed the need for exercise.     Past Medical History  Diagnosis Date  . Environmental allergies   . GERD (gastroesophageal reflux disease)   . Hypercholesterolemia   . Arthritis   . Allergy   . Hypertension    Past Surgical History  Procedure Laterality Date  . Tubal ligation  10/1991  . Bladder surgery  1973   Family History  Problem Relation Age of Onset  . Adopted: Yes  . Congenital heart disease Son     Tetrology of fallot   Social History   Social History  . Marital Status: Married    Spouse Name: N/A  . Number of Children: 2  . Years of Education: N/A   Occupational History  . [employer]   Social History Main Topics  . Smoking status: Never Smoker   . Smokeless tobacco: Never Used  . Alcohol Use: 0.0 oz/week    0 Standard drinks or equivalent per week     Comment: occasional  . Drug Use: No  . Sexual Activity: Not Asked   Other Topics Concern  . None   Social History Narrative    Outpatient Encounter Prescriptions as of 06/14/2015  Medication Sig  . Acetaminophen (TYLENOL ARTHRITIS PAIN PO) Take by  mouth 2 (two) times daily.  Marland Kitchen acyclovir ointment (ZOVIRAX) 5 % Apply topical as directed  . clotrimazole-betamethasone (LOTRISONE) cream Apply 1 application topically 2 (two) times daily. 7-10 days  . fexofenadine (ALLEGRA) 180 MG tablet Take 180 mg by mouth daily.  Marland Kitchen losartan (COZAAR) 25 MG tablet TAKE ONE (1) TABLET EACH DAY  . omeprazole (PRILOSEC) 20 MG capsule Take 20 mg by mouth daily.  . cefUROXime (CEFTIN) 250 MG tablet Take 1 tablet (250 mg total) by mouth 2 (two) times daily with a meal.  . [DISCONTINUED] amoxicillin-clavulanate (AUGMENTIN) 875-125 MG tablet Take 1 tablet by mouth 2 (two) times daily.   No facility-administered encounter medications on file as of 06/14/2015.    Review of Systems  Constitutional: Negative for fever and appetite change.  HENT: Positive for congestion, postnasal drip and sinus pressure.   Eyes: Negative for discharge and redness.  Respiratory: Positive for cough. Negative for chest tightness and shortness of breath.   Cardiovascular: Negative for chest pain, palpitations and leg swelling.  Gastrointestinal: Negative for nausea, vomiting, abdominal pain and diarrhea.  Genitourinary: Negative for dysuria and difficulty urinating.  Musculoskeletal: Negative for back pain.       Foot pain as outlined.    Skin:  Negative for color change and rash.  Neurological: Negative for dizziness, light-headedness and headaches.  Psychiatric/Behavioral: Negative for dysphoric mood and agitation.       Objective:    Physical Exam  Constitutional: She appears well-developed and well-nourished. No distress.  HENT:  Mouth/Throat: Oropharynx is clear and moist.  Nares- erythematous turbinates.  Minimal tenderness to palpation over the sinuses.    Eyes: Conjunctivae are normal. Right eye exhibits no discharge. Left eye exhibits no discharge.  Neck: Neck supple. No thyromegaly present.  Cardiovascular: Normal rate and regular rhythm.   Pulmonary/Chest: Breath  sounds normal. No respiratory distress. She has no wheezes.  Abdominal: Soft. Bowel sounds are normal. There is no tenderness.  Musculoskeletal: She exhibits no edema or tenderness.  Lymphadenopathy:    She has no cervical adenopathy.  Skin: No rash noted. No erythema.  Psychiatric: She has a normal mood and affect. Her behavior is normal.    BP 120/82 mmHg  Pulse 71  Temp(Src) 98.2 F (36.8 C) (Oral)  Resp 18  Ht _0  (1.676 m)  Wt 308 lb 8 oz (139.935 kg)  BMI 49.82 kg/m2  SpO2 96% Wt Readings from Last 3 Encounters:  06/14/15 308 lb 8 oz (139.935 kg)  03/05/15 307 lb (139.254 kg)  06/28/14 301 lb 8 oz (136.76 kg)     Lab Results  Component Value Date   CHOL 213* 12/15/2013   TRIG 219* 12/15/2013   HDL 55 12/15/2013   LDLCALC 114 12/15/2013   ALT 17 12/05/2013   AST 20 12/05/2013   NA 140 12/05/2013   K 5.0 12/05/2013   CREATININE 0.6 12/05/2013   BUN 10 12/05/2013   HGBA1C 6.9* 12/05/2013       Assessment & Plan:   Problem List Items Addressed This Visit    Diabetes mellitus ([place])    Low carb diet and exercise.  Follow met b and a1c.        Essential hypertension, benign    Blood pressure under good control.  Continue same medication regimen.  Follow pressures.  Follow metabolic panel.        GERD (gastroesophageal reflux disease)    On omeprazole.  Symptoms controlled.        Heel pain, bilateral    Better.  Hold on podiatry referral.       History of colonic polyps    Coloscopy 08/05/12 - as outlined.  Recommend f/u colonoscopy in 07/2022.        Hypercholesterolemia    Low cholesterol diet and exercise.  Follow lipid panel.        Obesity    Discussed diet and exercise.        Sinusitis    Treat with saline nasal spray and nasacort nasal spray as outlined.  Mucinex/robitussin as directed.  rx given for abx.  Will only take if symptoms worsen or progress.        Relevant Medications   cefUROXime (CEFTIN) 250 MG tablet   Stress    Son  is planning to have open heart surgery.  Handling stress well.  Follow.        Other Visit Diagnoses    Encounter for immunization    -  Primary        Einar Pheasant, MD

## 2015-06-16 ENCOUNTER — Encounter: Payer: Self-pay | Admitting: Internal Medicine

## 2015-06-16 DIAGNOSIS — J329 Chronic sinusitis, unspecified: Secondary | ICD-10-CM | POA: Insufficient documentation

## 2015-06-16 NOTE — Assessment & Plan Note (Signed)
Discussed diet and exercise 

## 2015-06-16 NOTE — Assessment & Plan Note (Signed)
Son is planning to have open heart surgery.  Handling stress well.  Follow.

## 2015-06-16 NOTE — Assessment & Plan Note (Signed)
Coloscopy 08/05/12 - as outlined.  Recommend f/u colonoscopy in 07/2022.

## 2015-06-16 NOTE — Assessment & Plan Note (Signed)
Low cholesterol diet and exercise.  Follow lipid panel.   

## 2015-06-16 NOTE — Assessment & Plan Note (Signed)
On omeprazole.  Symptoms controlled.  

## 2015-06-16 NOTE — Assessment & Plan Note (Signed)
Better.  Hold on podiatry referral.

## 2015-06-16 NOTE — Assessment & Plan Note (Signed)
Blood pressure under good control.  Continue same medication regimen.  Follow pressures.  Follow metabolic panel.   

## 2015-06-16 NOTE — Assessment & Plan Note (Signed)
Low carb diet and exercise.  Follow met b and a1c.   

## 2015-06-16 NOTE — Assessment & Plan Note (Signed)
Treat with saline nasal spray and nasacort nasal spray as outlined.  Mucinex/robitussin as directed.  rx given for abx.  Will only take if symptoms worsen or progress.

## 2015-07-10 ENCOUNTER — Encounter: Payer: Self-pay | Admitting: Internal Medicine

## 2015-08-09 ENCOUNTER — Ambulatory Visit: Payer: Self-pay | Admitting: Sports Medicine

## 2015-08-14 ENCOUNTER — Encounter: Payer: Self-pay | Admitting: Internal Medicine

## 2015-09-20 ENCOUNTER — Encounter: Payer: 59 | Admitting: Internal Medicine

## 2015-11-15 ENCOUNTER — Other Ambulatory Visit: Payer: Self-pay | Admitting: Internal Medicine

## 2016-01-31 ENCOUNTER — Encounter: Payer: 59 | Admitting: Internal Medicine

## 2016-02-05 ENCOUNTER — Other Ambulatory Visit (HOSPITAL_COMMUNITY)
Admission: RE | Admit: 2016-02-05 | Discharge: 2016-02-05 | Disposition: A | Discharge status: Home / Self Care | Payer: 59 | Source: Ambulatory Visit | Attending: Internal Medicine | Admitting: Internal Medicine

## 2016-02-05 ENCOUNTER — Encounter: Payer: Self-pay | Admitting: Internal Medicine

## 2016-02-05 ENCOUNTER — Ambulatory Visit (INDEPENDENT_AMBULATORY_CARE_PROVIDER_SITE_OTHER): Payer: 59 | Admitting: Internal Medicine

## 2016-02-05 VITALS — BP 140/88 | HR 76 | Temp 98.0°F | Ht 66.0 in | Wt 311.4 lb

## 2016-02-05 DIAGNOSIS — Z01419 Encounter for gynecological examination (general) (routine) without abnormal findings: Secondary | ICD-10-CM | POA: Insufficient documentation

## 2016-02-05 DIAGNOSIS — M79671 Pain in right foot: Secondary | ICD-10-CM

## 2016-02-05 DIAGNOSIS — Z658 Other specified problems related to psychosocial circumstances: Secondary | ICD-10-CM | POA: Diagnosis not present

## 2016-02-05 DIAGNOSIS — Z0000 Encounter for general adult medical examination without abnormal findings: Secondary | ICD-10-CM

## 2016-02-05 DIAGNOSIS — Z1151 Encounter for screening for human papillomavirus (HPV): Secondary | ICD-10-CM | POA: Insufficient documentation

## 2016-02-05 DIAGNOSIS — E7800 Pure hypercholesterolemia, unspecified: Secondary | ICD-10-CM

## 2016-02-05 DIAGNOSIS — Z86010 Personal history of colonic polyps: Secondary | ICD-10-CM

## 2016-02-05 DIAGNOSIS — I10 Essential (primary) hypertension: Secondary | ICD-10-CM

## 2016-02-05 DIAGNOSIS — Z23 Encounter for immunization: Secondary | ICD-10-CM

## 2016-02-05 DIAGNOSIS — N950 Postmenopausal bleeding: Secondary | ICD-10-CM

## 2016-02-05 DIAGNOSIS — Z1239 Encounter for other screening for malignant neoplasm of breast: Secondary | ICD-10-CM

## 2016-02-05 DIAGNOSIS — Z124 Encounter for screening for malignant neoplasm of cervix: Secondary | ICD-10-CM | POA: Diagnosis not present

## 2016-02-05 DIAGNOSIS — E669 Obesity, unspecified: Secondary | ICD-10-CM

## 2016-02-05 DIAGNOSIS — M79672 Pain in left foot: Secondary | ICD-10-CM

## 2016-02-05 DIAGNOSIS — F439 Reaction to severe stress, unspecified: Secondary | ICD-10-CM

## 2016-02-05 DIAGNOSIS — E119 Type 2 diabetes mellitus without complications: Secondary | ICD-10-CM

## 2016-02-05 DIAGNOSIS — K219 Gastro-esophageal reflux disease without esophagitis: Secondary | ICD-10-CM

## 2016-02-05 NOTE — Progress Notes (Signed)
Pre visit review using our clinic review tool, if applicable. No additional management support is needed unless otherwise documented below in the visit note. 

## 2016-02-05 NOTE — Progress Notes (Signed)
Patient ID: Wendy Callahan, female   DOB: 1960/08/09, 55 y.o.   MRN: [id number]   Subjective:    Patient ID: Wendy Callahan, female    DOB: 1960/06/03, 55 y.o.   MRN: [id number]  HPI  Patient here for her physical exam.  She is doing well.  Feet are better.  No pain.  She has cut out "white" in her diet.  Decreased red meat.  Eating and drinking well.  No nausea or vomiting.  Bowels doing well.  No chest pain.  No sob.  No acid reflux.  Watches her portion sizes.  Does eat late.  Discussed diet and exercise today.  She reported having a period 5 weeks ago.  First in the last 18 months to 2 years.  Discussed with her.  Discussed the need for gyn referral.  She declined.     Past Medical History:  Diagnosis Date  . Allergy   . Arthritis   . Environmental allergies   . GERD (gastroesophageal reflux disease)   . Hypercholesterolemia   . Hypertension    Past Surgical History:  Procedure Laterality Date  . BLADDER SURGERY  1973  . TUBAL LIGATION  10/1991   Family History  Problem Relation Age of Onset  . Adopted: Yes  . Congenital heart disease Son     Tetrology of fallot   Social History   Social History  . Marital status: Married    Spouse name: N/A  . Number of children: 2  . Years of education: N/A   Occupational History  . [place]   Social History Main Topics  . Smoking status: Never Smoker  . Smokeless tobacco: Never Used  . Alcohol use 0.0 oz/week     Comment: occasional  . Drug use: No  . Sexual activity: Not Asked   Other Topics Concern  . None   Social History Narrative  . None    Outpatient Encounter Prescriptions as of 02/05/2016  Medication Sig  . Acetaminophen (TYLENOL ARTHRITIS PAIN PO) Take by mouth 2 (two) times daily.  Marland Kitchen acyclovir ointment (ZOVIRAX) 5 % Apply topical as directed  . cefUROXime (CEFTIN) 250 MG tablet Take 1 tablet (250 mg total) by mouth 2 (two) times daily with a meal.  .  clotrimazole-betamethasone (LOTRISONE) cream Apply 1 application topically 2 (two) times daily. 7-10 days  . fexofenadine (ALLEGRA) 180 MG tablet Take 180 mg by mouth daily.  Marland Kitchen losartan (COZAAR) 25 MG tablet Take 1 tablet (25 mg total) by mouth daily.  Marland Kitchen omeprazole (PRILOSEC) 20 MG capsule Take 20 mg by mouth daily.  . [DISCONTINUED] acyclovir ointment (ZOVIRAX) 5 % Apply topical as directed  . [DISCONTINUED] losartan (COZAAR) 25 MG tablet TAKE 1 TABLET BY MOUTH EVERY DAY.   No facility-administered encounter medications on file as of 02/05/2016.     Review of Systems  Constitutional: Negative for appetite change and unexpected weight change.  HENT: Negative for congestion and sinus pressure.   Eyes: Negative for pain and visual disturbance.  Respiratory: Negative for cough, chest tightness and shortness of breath.   Cardiovascular: Negative for chest pain, palpitations and leg swelling.  Gastrointestinal: Negative for abdominal pain, diarrhea, nausea and vomiting.  Genitourinary: Negative for difficulty urinating and dysuria.       Vaginal bleeding after 2 years.    Musculoskeletal: Negative for back pain and joint swelling.       Feet are better.   Skin: Negative  for color change and rash.  Neurological: Negative for dizziness, light-headedness and headaches.  Hematological: Negative for adenopathy. Does not bruise/bleed easily.  Psychiatric/Behavioral: Negative for agitation and dysphoric mood.       Objective:    Physical Exam  Constitutional: She is oriented to person, place, and time. She appears well-developed and well-nourished. No distress.  HENT:  Nose: Nose normal.  Mouth/Throat: Oropharynx is clear and moist.  Eyes: Right eye exhibits no discharge. Left eye exhibits no discharge. No scleral icterus.  Neck: Neck supple. No thyromegaly present.  Cardiovascular: Normal rate and regular rhythm.   Pulmonary/Chest: Breath sounds normal. No accessory muscle usage. No  tachypnea. No respiratory distress. She has no decreased breath sounds. She has no wheezes. She has no rhonchi. Right breast exhibits no inverted nipple, no mass, no nipple discharge and no tenderness (no axillary adenopathy). Left breast exhibits no inverted nipple, no mass, no nipple discharge and no tenderness (no axilarry adenopathy).  Abdominal: Soft. Bowel sounds are normal. There is no tenderness.  Genitourinary:  Genitourinary Comments: Normal external genitalia.  Vaginal vault without lesions.  Cervix identified.  Pap smear performed.  Could not appreciate any adnexal masses or tenderness.    Musculoskeletal: She exhibits no edema or tenderness.  Lymphadenopathy:    She has no cervical adenopathy.  Neurological: She is alert and oriented to person, place, and time.  Skin: Skin is warm. No rash noted. No erythema.  Psychiatric: She has a normal mood and affect. Her behavior is normal.    BP 140/88   Pulse 76   Temp 98 F (36.7 C) (Oral)   Ht _0  (1.676 m)   Wt (!) 311 lb 6.4 oz (141.3 kg)   SpO2 95%   BMI 50.26 kg/m  Wt Readings from Last 3 Encounters:  02/05/16 (!) 311 lb 6.4 oz (141.3 kg)  06/14/15 (!) 308 lb 8 oz (139.9 kg)  03/05/15 (!) 307 lb (139.3 kg)     Lab Results  Component Value Date   CHOL 213 (A) 12/15/2013   TRIG 219 (A) 12/15/2013   HDL 55 12/15/2013   LDLCALC 114 12/15/2013   ALT 17 12/05/2013   AST 20 12/05/2013   NA 140 12/05/2013   K 5.0 12/05/2013   CREATININE 0.6 12/05/2013   BUN 10 12/05/2013   HGBA1C 6.9 (A) 12/05/2013       Assessment & Plan:   Problem List Items Addressed This Visit    Diabetes mellitus ([place])    Low carb diet and exercise.  Follow met b and a1c.  She will fax labs.  Keep up to date with eye checks.        Relevant Medications   losartan (COZAAR) 25 MG tablet   Essential hypertension, benign    Blood pressure initially elevated.  Better on recheck.  Have her spot check her pressure.  Follow.  Follow metabolic  panel.        Relevant Medications   losartan (COZAAR) 25 MG tablet   GERD (gastroesophageal reflux disease)    Controlled on omeprazole.        Health care maintenance    Physical today 02/05/16.  Overdue mammogram.  Scheduled.  PAP 02/05/16.  Colonoscopy 08/05/12.  Recommended f/u colonoscopy in 10 years.        Heel pain, bilateral    Adjusted her diet.  Foot pain better.  Follow.       History of colonic polyps    Colonoscopy 07/2012 as outlined.  Recommended f/u colonoscopy in 07/2022.        Hypercholesterolemia    Low cholesterol diet and exercise.  Follow lipid panel.  Had labs at work.  Wants me to review those labs prior to ordering more labs.  Follow. She will fax.        Relevant Medications   losartan (COZAAR) 25 MG tablet   Obesity    Diet and exercise.  Follow.        Post-menopausal bleeding    Had period after two years.  Discussed further w/up with her.  She decline.  Wanted to refer to gyn.  She declines.  Follow.  Will notify me if changes her mind.        Stress    Handling stress well.  Follow.         Other Visit Diagnoses    Screening breast examination    -  Primary   Relevant Orders   MM Digital Screening   Routine cervical smear       Relevant Orders   Cytology - PAP (Completed)   Encounter for immunization       Relevant Orders   Flu Vaccine QUAD 36+ mos IM (Completed)   Routine general medical examination at a health care facility           Einar Pheasant, MD

## 2016-02-05 NOTE — Assessment & Plan Note (Signed)
Physical today 02/05/16.  Overdue mammogram.  Scheduled.  PAP 02/05/16.  Colonoscopy 08/05/12.  Recommended f/u colonoscopy in 10 years.

## 2016-02-06 LAB — CYTOLOGY - PAP

## 2016-02-06 MED ORDER — LOSARTAN POTASSIUM 25 MG PO TABS: 25.0000 mg | ORAL_TABLET | Freq: Every day | ORAL | 3 refills | Status: DC

## 2016-02-06 MED ORDER — ACYCLOVIR 5 % EX OINT: TOPICAL_OINTMENT | CUTANEOUS | 0 refills | Status: DC

## 2016-02-07 ENCOUNTER — Encounter: Payer: Self-pay | Admitting: Internal Medicine

## 2016-02-09 ENCOUNTER — Encounter: Payer: Self-pay | Admitting: Internal Medicine

## 2016-02-09 DIAGNOSIS — N950 Postmenopausal bleeding: Secondary | ICD-10-CM | POA: Insufficient documentation

## 2016-02-09 NOTE — Assessment & Plan Note (Signed)
Blood pressure initially elevated.  Better on recheck.  Have her spot check her pressure.  Follow.  Follow metabolic panel.

## 2016-02-09 NOTE — Assessment & Plan Note (Signed)
Had period after two years.  Discussed further w/up with her.  She decline.  Wanted to refer to gyn.  She declines.  Follow.  Will notify me if changes her mind.

## 2016-02-09 NOTE — Assessment & Plan Note (Signed)
Diet and exercise.  Follow.  

## 2016-02-09 NOTE — Assessment & Plan Note (Signed)
Low cholesterol diet and exercise.  Follow lipid panel.  Had labs at work.  Wants me to review those labs prior to ordering more labs.  Follow. She will fax.

## 2016-02-09 NOTE — Assessment & Plan Note (Signed)
Handling stress well.  Follow.  

## 2016-02-09 NOTE — Assessment & Plan Note (Signed)
Low carb diet and exercise.  Follow met b and a1c.  She will fax labs.  Keep up to date with eye checks.

## 2016-02-09 NOTE — Assessment & Plan Note (Signed)
Controlled on omeprazole.   

## 2016-02-09 NOTE — Assessment & Plan Note (Signed)
Adjusted her diet.  Foot pain better.  Follow.

## 2016-02-09 NOTE — Assessment & Plan Note (Signed)
Colonoscopy 07/2012 as outlined.  Recommended f/u colonoscopy in 07/2022.

## 2016-02-12 NOTE — Telephone Encounter (Signed)
Sent letter

## 2016-02-21 ENCOUNTER — Ambulatory Visit
Admission: RE | Admit: 2016-02-21 | Discharge: 2016-02-21 | Disposition: A | Discharge status: Home / Self Care | Payer: 59 | Source: Ambulatory Visit | Attending: Internal Medicine | Admitting: Internal Medicine

## 2016-02-21 DIAGNOSIS — Z1239 Encounter for other screening for malignant neoplasm of breast: Secondary | ICD-10-CM

## 2016-02-21 DIAGNOSIS — Z1231 Encounter for screening mammogram for malignant neoplasm of breast: Secondary | ICD-10-CM | POA: Diagnosis not present

## 2016-03-30 ENCOUNTER — Encounter: Payer: Self-pay | Admitting: Internal Medicine

## 2016-03-30 DIAGNOSIS — M25562 Pain in left knee: Secondary | ICD-10-CM

## 2016-03-30 NOTE — Telephone Encounter (Signed)
Order placed for ortho referral.   

## 2016-05-07 ENCOUNTER — Ambulatory Visit: Payer: 59 | Admitting: Internal Medicine

## 2016-06-22 ENCOUNTER — Telehealth: Payer: 59 | Admitting: Family

## 2016-06-22 DIAGNOSIS — B9689 Other specified bacterial agents as the cause of diseases classified elsewhere: Secondary | ICD-10-CM

## 2016-06-22 DIAGNOSIS — J028 Acute pharyngitis due to other specified organisms: Secondary | ICD-10-CM

## 2016-06-22 MED ORDER — AZITHROMYCIN 250 MG PO TABS: ORAL_TABLET | ORAL | 0 refills | Status: DC

## 2016-06-22 MED ORDER — ALBUTEROL SULFATE HFA 108 (90 BASE) MCG/ACT IN AERS: 1.0000 | INHALATION_SPRAY | RESPIRATORY_TRACT | 2 refills | Status: DC | PRN

## 2016-06-22 MED ORDER — BENZONATATE 100 MG PO CAPS: 100.0000 mg | ORAL_CAPSULE | Freq: Three times a day (TID) | ORAL | 0 refills | Status: DC | PRN

## 2016-06-22 MED ORDER — PREDNISONE 5 MG PO TABS: 5.0000 mg | ORAL_TABLET | ORAL | 0 refills | Status: DC

## 2016-06-22 NOTE — Progress Notes (Signed)
We are sorry that you are not feeling well.  Here is how we plan to help!  Based on what you have shared with me it looks like you have upper respiratory tract inflammation that has resulted in a significant cough.  Inflammation and infection in the upper respiratory tract is commonly called bronchitis and has four common causes:  Allergies, Viral Infections, Acid Reflux and Bacterial Infections.  Allergies, viruses and acid reflux are treated by controlling symptoms or eliminating the cause. An example might be a cough caused by taking certain blood pressure medications. You stop the cough by changing the medication. Another example might be a cough caused by acid reflux. Controlling the reflux helps control the cough.  Based on your presentation I believe you most likely have A cough due to bacteria.  When patients have a fever and a productive cough with a change in color or increased sputum production, we are concerned about bacterial bronchitis.  If left untreated it can progress to pneumonia.  If your symptoms do not improve with your treatment plan it is important that you contact your provider.   I have prescribed Azithromyin 250 mg: two tables now and then one tablet daily for 4 additonal days    In addition you may use A non-prescription cough medication called Mucinex DM: take 2 tablets every 12 hours. and A prescription cough medication called Tessalon Perles 100mg . You may take 1-2 capsules every 8 hours as needed for your cough.  Sterapred 5 mg dosepak and Albuterol inhaler, take 1-2 puffs every 4 hours as needed for shortness of breath.   USE OF BRONCHODILATOR ("RESCUE") INHALERS: There is a risk from using your bronchodilator too frequently.  The risk is that over-reliance on a medication which only relaxes the muscles surrounding the breathing tubes can reduce the effectiveness of medications prescribed to reduce swelling and congestion of the tubes themselves.  Although you feel brief  relief from the bronchodilator inhaler, your asthma may actually be worsening with the tubes becoming more swollen and filled with mucus.  This can delay other crucial treatments, such as oral steroid medications. If you need to use a bronchodilator inhaler daily, several times per day, you should discuss this with your provider.  There are probably better treatments that could be used to keep your asthma under control.     HOME CARE . Only take medications as instructed by your medical team. . Complete the entire course of an antibiotic. . Drink plenty of fluids and get plenty of rest. . Avoid close contacts especially the very young and the elderly . Cover your mouth if you cough or cough into your sleeve. . Always remember to wash your hands . A steam or ultrasonic humidifier can help congestion.   GET HELP RIGHT AWAY IF: . You develop worsening fever. . You become short of breath . You cough up blood. . Your symptoms persist after you have completed your treatment plan MAKE SURE YOU   Understand these instructions.  Will watch your condition.  Will get help right away if you are not doing well or get worse.  Your e-visit answers were reviewed by a board certified advanced clinical practitioner to complete your personal care plan.  Depending on the condition, your plan could have included both over the counter or prescription medications. If there is a problem please reply  once you have received a response from your provider. Your safety is important to [place].  If you have drug allergies  check your prescription carefully.    You can use MyChart to ask questions about today's visit, request a non-urgent call back, or ask for a work or school excuse for 24 hours related to this e-Visit. If it has been greater than 24 hours you will need to follow up with your provider, or enter a new e-Visit to address those concerns. You will get an e-mail in the next two days asking about your  experience.  I hope that your e-visit has been valuable and will speed your recovery. Thank you for using e-visits.   

## 2016-06-24 ENCOUNTER — Encounter: Payer: Self-pay | Admitting: Internal Medicine

## 2016-06-25 ENCOUNTER — Encounter: Payer: Self-pay | Admitting: Internal Medicine

## 2016-06-25 ENCOUNTER — Ambulatory Visit (INDEPENDENT_AMBULATORY_CARE_PROVIDER_SITE_OTHER): Payer: 59 | Admitting: Internal Medicine

## 2016-06-25 VITALS — BP 130/84 | HR 83 | Temp 98.6°F | Resp 18 | Ht 66.0 in | Wt 315.6 lb

## 2016-06-25 DIAGNOSIS — R6889 Other general symptoms and signs: Secondary | ICD-10-CM

## 2016-06-25 DIAGNOSIS — J069 Acute upper respiratory infection, unspecified: Secondary | ICD-10-CM

## 2016-06-25 LAB — POCT INFLUENZA A/B
Influenza A, POC: NEGATIVE
Influenza B, POC: NEGATIVE

## 2016-06-25 NOTE — Progress Notes (Signed)
Pre-visit discussion using our clinic review tool. No additional management support is needed unless otherwise documented below in the visit note.  

## 2016-06-25 NOTE — Telephone Encounter (Signed)
Spoke to pt she is aware that we will work her in at the end of the day.

## 2016-06-25 NOTE — Telephone Encounter (Signed)
If worsening symptoms, agree with evaluation to determine etiology and see if anything more needed.  See if work in appt today available.

## 2016-06-25 NOTE — Progress Notes (Signed)
Patient ID: Wendy Callahan, female   DOB: 04-Nov-1960, 56 y.o.   MRN: [id number]   Subjective:    Patient ID: Wendy Callahan, female    DOB: 12-May-1961, 56 y.o.   MRN: [id number]  HPI  Patient here as a work in with concerns regarding increased cough and congestion.  Symptoms starte back in 05/2016.  Describes increased nasal congestion.  Sinus pressure.  Taking mucinex.  Raw throat.  Increased cough.  Had a E visit.  Was given azithromycin and rx for prednisone taper.  She started the abx, but never took the prednisone.  Tessalon perles helped cough some.  No vomiting or diarrhea.  Eating.    Past Medical History:  Diagnosis Date  . Allergy   . Arthritis   . Environmental allergies   . GERD (gastroesophageal reflux disease)   . Hypercholesterolemia   . Hypertension    Past Surgical History:  Procedure Laterality Date  . BLADDER SURGERY  1973  . TUBAL LIGATION  10/1991   Family History  Problem Relation Age of Onset  . Adopted: Yes  . Congenital heart disease Son     Tetrology of fallot   Social History   Social History  . Marital status: Married    Spouse name: N/A  . Number of children: 2  . Years of education: N/A   Occupational History  . [employer]   Social History Main Topics  . Smoking status: Never Smoker  . Smokeless tobacco: Never Used  . Alcohol use 0.0 oz/week     Comment: occasional  . Drug use: No  . Sexual activity: Not Asked   Other Topics Concern  . None   Social History Narrative  . None    Outpatient Encounter Prescriptions as of 06/25/2016  Medication Sig  . Acetaminophen (TYLENOL ARTHRITIS PAIN PO) Take by mouth 2 (two) times daily.  Marland Kitchen acyclovir ointment (ZOVIRAX) 5 % Apply topical as directed  . albuterol (PROVENTIL HFA;VENTOLIN HFA) 108 (90 Base) MCG/ACT inhaler Inhale 1-2 puffs into the lungs every 4 (four) hours as needed for wheezing or shortness of breath.  Marland Kitchen azithromycin (ZITHROMAX) 250  MG tablet Take 2 tabs now then 1 daily times 4 days.  . benzonatate (TESSALON PERLES) 100 MG capsule Take 1-2 capsules (100-200 mg total) by mouth every 8 (eight) hours as needed for cough.  . cefUROXime (CEFTIN) 250 MG tablet Take 1 tablet (250 mg total) by mouth 2 (two) times daily with a meal.  . clotrimazole-betamethasone (LOTRISONE) cream Apply 1 application topically 2 (two) times daily. 7-10 days  . fexofenadine (ALLEGRA) 180 MG tablet Take 180 mg by mouth daily.  Marland Kitchen losartan (COZAAR) 25 MG tablet Take 1 tablet (25 mg total) by mouth daily.  Marland Kitchen omeprazole (PRILOSEC) 20 MG capsule Take 20 mg by mouth daily.  . predniSONE (DELTASONE) 5 MG tablet Take 1 tablet (5 mg total) by mouth as directed. 21 dose taper per pharmacy   No facility-administered encounter medications on file as of 06/25/2016.     Review of Systems  Constitutional: Negative for appetite change.       Previous fever.  Taking tylenol.   HENT: Positive for congestion and sinus pressure.   Respiratory: Positive for cough. Negative for chest tightness and shortness of breath.   Cardiovascular: Negative for chest pain and leg swelling.  Gastrointestinal: Negative for diarrhea, nausea and vomiting.  Musculoskeletal: Negative for joint swelling and myalgias.  Skin: Negative  for color change and rash.  Neurological: Negative for dizziness and light-headedness.       Objective:    Physical Exam  Constitutional: She appears well-developed and well-nourished. No distress.  HENT:  Mouth/Throat: Oropharynx is clear and moist.  Nares - slightly erythematous turbinates.    Neck: Neck supple.  Cardiovascular: Normal rate and regular rhythm.   Pulmonary/Chest: Breath sounds normal. No respiratory distress.  Increased cough with expiration.    Lymphadenopathy:    She has no cervical adenopathy.  Skin: No rash noted. No erythema.  Psychiatric: She has a normal mood and affect. Her behavior is normal.    BP 130/84 (BP Location:  Left Arm, Patient Position: Sitting, Cuff Size: Large)   Pulse 83   Temp 98.6 F (37 C) (Oral)   Resp 18   Ht 5\' 6"  (1.676 m)   Wt (!) 315 lb 9.6 oz (143.2 kg)   SpO2 95%   BMI 50.94 kg/m  Wt Readings from Last 3 Encounters:  06/25/16 (!) 315 lb 9.6 oz (143.2 kg)  02/05/16 (!) 311 lb 6.4 oz (141.3 kg)  06/14/15 (!) 308 lb 8 oz (139.9 kg)     Lab Results  Component Value Date   CHOL 213 (A) 12/15/2013   TRIG 219 (A) 12/15/2013   HDL 55 12/15/2013   LDLCALC 114 12/15/2013   ALT 17 12/05/2013   AST 20 12/05/2013   NA 140 12/05/2013   K 5.0 12/05/2013   CREATININE 0.6 12/05/2013   BUN 10 12/05/2013   HGBA1C 6.9 (A) 12/05/2013    Mm Digital Screening  Result Date: 02/21/2016 CLINICAL DATA:  Screening. EXAM: DIGITAL SCREENING BILATERAL MAMMOGRAM WITH CAD COMPARISON:  Previous exam(s). ACR Breast Density Category b: There are scattered areas of fibroglandular density. FINDINGS: There are no findings suspicious for malignancy. Images were processed with CAD. IMPRESSION: No mammographic evidence of malignancy. A result letter of this screening mammogram will be mailed directly to the patient. RECOMMENDATION: Screening mammogram in one year. (Code:SM-B-01Y) BI-RADS CATEGORY  1: Negative. Electronically Signed   By: Nolon Nations M.D.   On: 02/21/2016 09:37       Assessment & Plan:   Problem List Items Addressed This Visit    URI (upper respiratory infection)    Increased cough and congestion as outlined.  Also with sinus symptoms.  Was started on zpak through E visit.  Is some better.  Still with increased cough and congestion.  She never took the prednisone.  Will start prednisone taper.  mucinex as directed.  Saline nasal spray and nasacort as directed.  Follow.        Other Visit Diagnoses    Flu-like symptoms    -  Primary   Relevant Orders   POCT Influenza A/B (Completed)       Einar Pheasant, MD

## 2016-07-05 ENCOUNTER — Encounter: Payer: Self-pay | Admitting: Internal Medicine

## 2016-07-05 DIAGNOSIS — J069 Acute upper respiratory infection, unspecified: Secondary | ICD-10-CM | POA: Insufficient documentation

## 2016-07-05 NOTE — Assessment & Plan Note (Signed)
Increased cough and congestion as outlined.  Also with sinus symptoms.  Was started on zpak through E visit.  Is some better.  Still with increased cough and congestion.  She never took the prednisone.  Will start prednisone taper.  mucinex as directed.  Saline nasal spray and nasacort as directed.  Follow.

## 2016-07-07 ENCOUNTER — Encounter: Payer: Self-pay | Admitting: Internal Medicine

## 2016-07-27 ENCOUNTER — Other Ambulatory Visit: Payer: Self-pay | Admitting: Internal Medicine

## 2017-02-09 ENCOUNTER — Ambulatory Visit (INDEPENDENT_AMBULATORY_CARE_PROVIDER_SITE_OTHER): Payer: 59

## 2017-02-09 ENCOUNTER — Other Ambulatory Visit: Payer: Self-pay | Admitting: Internal Medicine

## 2017-02-09 ENCOUNTER — Encounter: Payer: Self-pay | Admitting: Internal Medicine

## 2017-02-09 ENCOUNTER — Ambulatory Visit (INDEPENDENT_AMBULATORY_CARE_PROVIDER_SITE_OTHER): Payer: 59 | Admitting: Internal Medicine

## 2017-02-09 VITALS — BP 138/82 | HR 72 | Temp 98.2°F | Resp 17 | Ht 66.14 in | Wt 325.2 lb

## 2017-02-09 DIAGNOSIS — Z6841 Body Mass Index (BMI) 40.0 and over, adult: Secondary | ICD-10-CM

## 2017-02-09 DIAGNOSIS — R52 Pain, unspecified: Secondary | ICD-10-CM

## 2017-02-09 DIAGNOSIS — K219 Gastro-esophageal reflux disease without esophagitis: Secondary | ICD-10-CM | POA: Diagnosis not present

## 2017-02-09 DIAGNOSIS — E119 Type 2 diabetes mellitus without complications: Secondary | ICD-10-CM

## 2017-02-09 DIAGNOSIS — F439 Reaction to severe stress, unspecified: Secondary | ICD-10-CM | POA: Diagnosis not present

## 2017-02-09 DIAGNOSIS — M25562 Pain in left knee: Secondary | ICD-10-CM

## 2017-02-09 DIAGNOSIS — I10 Essential (primary) hypertension: Secondary | ICD-10-CM

## 2017-02-09 DIAGNOSIS — Z0000 Encounter for general adult medical examination without abnormal findings: Secondary | ICD-10-CM | POA: Diagnosis not present

## 2017-02-09 DIAGNOSIS — Z1231 Encounter for screening mammogram for malignant neoplasm of breast: Secondary | ICD-10-CM

## 2017-02-09 DIAGNOSIS — Z23 Encounter for immunization: Secondary | ICD-10-CM

## 2017-02-09 DIAGNOSIS — M179 Osteoarthritis of knee, unspecified: Secondary | ICD-10-CM | POA: Diagnosis not present

## 2017-02-09 DIAGNOSIS — E7800 Pure hypercholesterolemia, unspecified: Secondary | ICD-10-CM

## 2017-02-09 DIAGNOSIS — M25561 Pain in right knee: Secondary | ICD-10-CM | POA: Diagnosis not present

## 2017-02-09 DIAGNOSIS — Z1239 Encounter for other screening for malignant neoplasm of breast: Secondary | ICD-10-CM

## 2017-02-09 NOTE — Progress Notes (Signed)
Patient ID: Wendy Callahan, female   DOB: 12/29/1960, 56 y.o.   MRN: [id number]   Subjective:    Patient ID: Wendy Callahan, female    DOB: 1961/03/07, 56 y.o.   MRN: [id number]  HPI  Patient here for her physical exam.  She reports she is doing relatively well. Has gained weight.  Discussed diet and exercise.  She is having increased right knee pain.  Has had issues with both knees.  No known injury.  No chest pain.  No sob.  No acid reflux.  No abdominal pain.  Bowels moving.     Past Medical History:  Diagnosis Date  . Allergy   . Arthritis   . Environmental allergies   . GERD (gastroesophageal reflux disease)   . Hypercholesterolemia   . Hypertension    Past Surgical History:  Procedure Laterality Date  . BLADDER SURGERY  1973  . TUBAL LIGATION  10/1991   Family History  Problem Relation Age of Onset  . Adopted: Yes  . Congenital heart disease Son        Tetrology of fallot   Social History   Social History  . Marital status: Married    Spouse name: N/A  . Number of children: 2  . Years of education: N/A   Occupational History  . [employer]   Social History Main Topics  . Smoking status: Never Smoker  . Smokeless tobacco: Never Used  . Alcohol use 0.0 oz/week     Comment: occasional  . Drug use: No  . Sexual activity: Not Asked   Other Topics Concern  . None   Social History Narrative  . None    Outpatient Encounter Prescriptions as of 02/09/2017  Medication Sig  . Acetaminophen (TYLENOL ARTHRITIS PAIN PO) Take by mouth 2 (two) times daily.  Marland Kitchen acyclovir ointment (ZOVIRAX) 5 % Apply topical as directed  . albuterol (PROVENTIL HFA;VENTOLIN HFA) 108 (90 Base) MCG/ACT inhaler Inhale 1-2 puffs into the lungs every 4 (four) hours as needed for wheezing or shortness of breath.  Marland Kitchen azithromycin (ZITHROMAX) 250 MG tablet Take 2 tabs now then 1 daily times 4 days.  . benzonatate (TESSALON PERLES) 100 MG capsule Take  1-2 capsules (100-200 mg total) by mouth every 8 (eight) hours as needed for cough.  . cefUROXime (CEFTIN) 250 MG tablet Take 1 tablet (250 mg total) by mouth 2 (two) times daily with a meal.  . clotrimazole-betamethasone (LOTRISONE) cream Apply 1 application topically 2 (two) times daily. 7-10 days  . fexofenadine (ALLEGRA) 180 MG tablet Take 180 mg by mouth daily.  Marland Kitchen losartan (COZAAR) 25 MG tablet TAKE ONE TABLET BY MOUTH DAILY  . omeprazole (PRILOSEC) 20 MG capsule Take 20 mg by mouth daily.  . predniSONE (DELTASONE) 5 MG tablet Take 1 tablet (5 mg total) by mouth as directed. 21 dose taper per pharmacy   No facility-administered encounter medications on file as of 02/09/2017.     Review of Systems  Constitutional: Negative for appetite change and unexpected weight change.  HENT: Negative for congestion and sinus pressure.   Eyes: Negative for pain and visual disturbance.  Respiratory: Negative for cough, chest tightness and shortness of breath.   Cardiovascular: Negative for chest pain, palpitations and leg swelling.  Gastrointestinal: Negative for abdominal pain, diarrhea and nausea.  Genitourinary: Negative for difficulty urinating and dysuria.  Musculoskeletal: Negative for myalgias.       Knee pain as outlined.  Skin: Negative for color change and rash.  Neurological: Negative for dizziness, light-headedness and headaches.  Hematological: Negative for adenopathy. Does not bruise/bleed easily.  Psychiatric/Behavioral: Negative for agitation and dysphoric mood.       Objective:    Physical Exam  Constitutional: She is oriented to person, place, and time. She appears well-developed and well-nourished. No distress.  HENT:  Nose: Nose normal.  Mouth/Throat: Oropharynx is clear and moist.  Eyes: Right eye exhibits no discharge. Left eye exhibits no discharge. No scleral icterus.  Neck: Neck supple. No thyromegaly present.  Cardiovascular: Normal rate and regular rhythm.     Pulmonary/Chest: Breath sounds normal. No accessory muscle usage. No tachypnea. No respiratory distress. She has no decreased breath sounds. She has no wheezes. She has no rhonchi. Right breast exhibits no inverted nipple, no mass, no nipple discharge and no tenderness (no axillary adenopathy). Left breast exhibits no inverted nipple, no mass, no nipple discharge and no tenderness (no axilarry adenopathy).  Abdominal: Soft. Bowel sounds are normal. There is no tenderness.  Musculoskeletal: She exhibits no edema or tenderness.  Lymphadenopathy:    She has no cervical adenopathy.  Neurological: She is alert and oriented to person, place, and time.  Skin: Skin is warm. No rash noted. No erythema.  Psychiatric: She has a normal mood and affect. Her behavior is normal.    BP 138/82   Pulse 72   Temp 98.2 F (36.8 C) (Oral)   Resp 17   Ht 5' 6.14" (1.68 m)   Wt (!) 325 lb 3.2 oz (147.5 kg)   SpO2 96%   BMI 52.26 kg/m  Wt Readings from Last 3 Encounters:  02/09/17 (!) 325 lb 3.2 oz (147.5 kg)  06/25/16 (!) 315 lb 9.6 oz (143.2 kg)  02/05/16 (!) 311 lb 6.4 oz (141.3 kg)     Lab Results  Component Value Date   CHOL 213 (A) 12/15/2013   TRIG 219 (A) 12/15/2013   HDL 55 12/15/2013   LDLCALC 114 12/15/2013   ALT 17 12/05/2013   AST 20 12/05/2013   NA 140 12/05/2013   K 5.0 12/05/2013   CREATININE 0.6 12/05/2013   BUN 10 12/05/2013   HGBA1C 6.9 (A) 12/05/2013    Mm Digital Screening  Result Date: 02/21/2016 CLINICAL DATA:  Screening. EXAM: DIGITAL SCREENING BILATERAL MAMMOGRAM WITH CAD COMPARISON:  Previous exam(s). ACR Breast Density Category b: There are scattered areas of fibroglandular density. FINDINGS: There are no findings suspicious for malignancy. Images were processed with CAD. IMPRESSION: No mammographic evidence of malignancy. A result letter of this screening mammogram will be mailed directly to the patient. RECOMMENDATION: Screening mammogram in one year.  (Code:SM-B-01Y) BI-RADS CATEGORY  1: Negative. Electronically Signed   By: Nolon Nations M.D.   On: 02/21/2016 09:37       Assessment & Plan:   Problem List Items Addressed This Visit    BMI 50.0-59.9, adult ([place])    Discussed diet and exercise.  Follow.       Diabetes mellitus ([place])    Discussed low carb diet and exercise.  Follow met b and a1c.        Essential hypertension, benign    Blood pressure as outlined.  Have her spot check her pressure.  Follow metabolic panel.        GERD (gastroesophageal reflux disease)    Controlled on omeprazole.        Health care maintenance    Physical today 02/09/17.  PAP 02/05/16 -  negative with negative HPV.  Mammogram 02/21/16 - birads I.  Scheduled for f/u mammogram.  Colonoscopy 08/05/12 - recommended f/u colonoscopy in 10 years.        Hypercholesterolemia    Low cholesterol diet and exercise.  Follow lipid panel.        Stress    Handling stress well.  Follow.         Other Visit Diagnoses    Pain in both knees, unspecified chronicity    -  Primary   Pain in knee (right > left).  check xray.     Breast cancer screening       Relevant Orders   MM DIGITAL SCREENING BILATERAL   Need for immunization against influenza       Relevant Orders   Flu Vaccine QUAD 36+ mos IM (Completed)       Einar Pheasant, MD

## 2017-02-09 NOTE — Assessment & Plan Note (Addendum)
Physical today 02/09/17.  PAP 02/05/16 - negative with negative HPV.  Mammogram 02/21/16 - birads I.  Scheduled for f/u mammogram.  Colonoscopy 08/05/12 - recommended f/u colonoscopy in 10 years.

## 2017-02-11 ENCOUNTER — Telehealth: Payer: Self-pay | Admitting: *Deleted

## 2017-02-11 NOTE — Telephone Encounter (Signed)
See results notes for further documentation.

## 2017-02-11 NOTE — Telephone Encounter (Signed)
Pt requested a returned call Pt contact [phone]

## 2017-02-12 ENCOUNTER — Encounter: Payer: Self-pay | Admitting: Internal Medicine

## 2017-02-12 NOTE — Assessment & Plan Note (Signed)
Controlled on omeprazole.   

## 2017-02-12 NOTE — Assessment & Plan Note (Signed)
Blood pressure as outlined.  Have her spot check her pressure.  Follow metabolic panel.

## 2017-02-12 NOTE — Assessment & Plan Note (Signed)
Discussed low carb diet and exercise.  Follow met b and a1c.   

## 2017-02-12 NOTE — Assessment & Plan Note (Signed)
Discussed diet and exercise.  Follow.  

## 2017-02-12 NOTE — Assessment & Plan Note (Signed)
Low cholesterol diet and exercise.  Follow lipid panel.   

## 2017-02-12 NOTE — Assessment & Plan Note (Signed)
Handling stress well.  Follow.  

## 2017-02-13 ENCOUNTER — Other Ambulatory Visit: Payer: Self-pay | Admitting: Internal Medicine

## 2017-02-13 DIAGNOSIS — M25562 Pain in left knee: Principal | ICD-10-CM

## 2017-02-13 DIAGNOSIS — M25561 Pain in right knee: Secondary | ICD-10-CM

## 2017-02-13 NOTE — Progress Notes (Signed)
Order placed for referral to physical therapy.  

## 2017-02-24 ENCOUNTER — Ambulatory Visit
Admission: RE | Admit: 2017-02-24 | Discharge: 2017-02-24 | Disposition: A | Discharge status: Home / Self Care | Payer: 59 | Source: Ambulatory Visit | Attending: Internal Medicine | Admitting: Internal Medicine

## 2017-02-24 DIAGNOSIS — Z1239 Encounter for other screening for malignant neoplasm of breast: Secondary | ICD-10-CM

## 2017-02-24 DIAGNOSIS — Z1231 Encounter for screening mammogram for malignant neoplasm of breast: Secondary | ICD-10-CM | POA: Insufficient documentation

## 2017-03-12 ENCOUNTER — Other Ambulatory Visit: Payer: Self-pay | Admitting: Internal Medicine

## 2017-04-12 ENCOUNTER — Encounter: Payer: Self-pay | Admitting: Internal Medicine

## 2017-04-12 DIAGNOSIS — I10 Essential (primary) hypertension: Secondary | ICD-10-CM

## 2017-04-12 DIAGNOSIS — E7800 Pure hypercholesterolemia, unspecified: Secondary | ICD-10-CM

## 2017-04-12 DIAGNOSIS — E119 Type 2 diabetes mellitus without complications: Secondary | ICD-10-CM

## 2017-04-13 NOTE — Telephone Encounter (Signed)
I have placed the orders.  I have the printed labs, but she should be able to go to [facility] without printed orders.   They should be able to see orders.

## 2017-06-11 ENCOUNTER — Other Ambulatory Visit: Payer: Self-pay | Admitting: Internal Medicine

## 2017-09-09 ENCOUNTER — Encounter: Payer: Self-pay | Admitting: Internal Medicine

## 2017-09-09 ENCOUNTER — Other Ambulatory Visit: Payer: Self-pay | Admitting: Internal Medicine

## 2017-09-14 ENCOUNTER — Ambulatory Visit (INDEPENDENT_AMBULATORY_CARE_PROVIDER_SITE_OTHER): Payer: 59 | Admitting: Internal Medicine

## 2017-09-14 DIAGNOSIS — K219 Gastro-esophageal reflux disease without esophagitis: Secondary | ICD-10-CM

## 2017-09-14 DIAGNOSIS — E119 Type 2 diabetes mellitus without complications: Secondary | ICD-10-CM | POA: Diagnosis not present

## 2017-09-14 DIAGNOSIS — I10 Essential (primary) hypertension: Secondary | ICD-10-CM | POA: Diagnosis not present

## 2017-09-14 DIAGNOSIS — Z6841 Body Mass Index (BMI) 40.0 and over, adult: Secondary | ICD-10-CM

## 2017-09-14 DIAGNOSIS — E7800 Pure hypercholesterolemia, unspecified: Secondary | ICD-10-CM | POA: Diagnosis not present

## 2017-09-14 MED ORDER — LOSARTAN POTASSIUM 50 MG PO TABS: 50.0000 mg | ORAL_TABLET | Freq: Every day | ORAL | 1 refills | Status: DC

## 2017-09-14 MED ORDER — PIMECROLIMUS 1 % EX CREA: TOPICAL_CREAM | Freq: Two times a day (BID) | CUTANEOUS | 0 refills | Status: DC | PRN

## 2017-09-14 NOTE — Progress Notes (Signed)
Patient ID: Wendy Callahan, female   DOB: 01-27-1961, 57 y.o.   MRN: [id number]   Subjective:    Patient ID: Wendy Callahan, female    DOB: [birth year], 57 y.o.   MRN: [id number]  HPI  Patient here for a scheduled follow up.  She reports she is doing relatively well.  Has started walking.  No chest pain.  No sob.  No acid reflux.  No abdominal pain or cramping.  Bowels moving.  Weight still up.  Discussed diet and exercise.  Request refill on elidel.  Uses prn.     Past Medical History:  Diagnosis Date  . Allergy   . Arthritis   . Environmental allergies   . GERD (gastroesophageal reflux disease)   . Hypercholesterolemia   . Hypertension    Past Surgical History:  Procedure Laterality Date  . BLADDER SURGERY  1973  . TUBAL LIGATION  10/1991   Family History  Adopted: Yes  Problem Relation Age of Onset  . Congenital heart disease Son        Tetrology of fallot   Social History   Socioeconomic History  . Marital status: Married    Spouse name: Not on file  . Number of children: 2  . Years of education: Not on file  . Highest education level: Not on file  Occupational History  . Occupation: Scientist, research (physical sciences): [employer]    Comment: [place]  . Financial resource strain: Not on file  . Food insecurity:    Worry: Not on file    Inability: Not on file  . Transportation needs:    Medical: Not on file    Non-medical: Not on file  Tobacco Use  . Smoking status: Never Smoker  . Smokeless tobacco: Never Used  Substance and Sexual Activity  . Alcohol use: Yes    Alcohol/week: 0.0 oz    Comment: occasional  . Drug use: No  . Sexual activity: Not on file  Lifestyle  . Physical activity:    Days per week: Not on file    Minutes per session: Not on file  . Stress: Not on file  Relationships  . Social connections:    Talks on phone: Not on file    Gets together: Not on file    Attends religious service: Not on file    Active  member of club or organization: Not on file    Attends meetings of clubs or organizations: Not on file    Relationship status: Not on file  Other Topics Concern  . Not on file  Social History Narrative  . Not on file    Outpatient Encounter Medications as of 09/14/2017  Medication Sig  . Acetaminophen (TYLENOL ARTHRITIS PAIN PO) Take by mouth 2 (two) times daily.  Marland Kitchen acyclovir ointment (ZOVIRAX) 5 % Apply topical as directed  . fexofenadine (ALLEGRA) 180 MG tablet Take 180 mg by mouth daily.  Marland Kitchen losartan (COZAAR) 50 MG tablet Take 1 tablet (50 mg total) by mouth daily.  Marland Kitchen omeprazole (PRILOSEC) 20 MG capsule Take 20 mg by mouth daily.  . pimecrolimus (ELIDEL) 1 % cream Apply topically 2 (two) times daily as needed.  . [DISCONTINUED] albuterol (PROVENTIL HFA;VENTOLIN HFA) 108 (90 Base) MCG/ACT inhaler Inhale 1-2 puffs into the lungs every 4 (four) hours as needed for wheezing or shortness of breath.  . [DISCONTINUED] azithromycin (ZITHROMAX) 250 MG tablet Take 2 tabs now then 1 daily times 4 days.  . [  DISCONTINUED] benzonatate (TESSALON PERLES) 100 MG capsule Take 1-2 capsules (100-200 mg total) by mouth every 8 (eight) hours as needed for cough.  . [DISCONTINUED] cefUROXime (CEFTIN) 250 MG tablet Take 1 tablet (250 mg total) by mouth 2 (two) times daily with a meal.  . [DISCONTINUED] clotrimazole-betamethasone (LOTRISONE) cream Apply 1 application topically 2 (two) times daily. 7-10 days  . [DISCONTINUED] losartan (COZAAR) 25 MG tablet TAKE 1 TABLET BY MOUTH DAILY  . [DISCONTINUED] predniSONE (DELTASONE) 5 MG tablet Take 1 tablet (5 mg total) by mouth as directed. 21 dose taper per pharmacy   No facility-administered encounter medications on file as of 09/14/2017.     Review of Systems  Constitutional: Negative for appetite change and unexpected weight change.  HENT: Negative for congestion and sinus pressure.   Respiratory: Negative for cough, chest tightness and shortness of breath.     Cardiovascular: Negative for chest pain, palpitations and leg swelling.  Gastrointestinal: Negative for abdominal pain, diarrhea, nausea and vomiting.  Genitourinary: Negative for difficulty urinating and dysuria.  Musculoskeletal: Negative for joint swelling and myalgias.  Skin: Negative for color change and rash.  Neurological: Negative for dizziness, light-headedness and headaches.  Psychiatric/Behavioral: Negative for agitation and dysphoric mood.       Objective:    Physical Exam  Constitutional: She appears well-developed and well-nourished. No distress.  HENT:  Nose: Nose normal.  Mouth/Throat: Oropharynx is clear and moist.  Neck: Neck supple. No thyromegaly present.  Cardiovascular: Normal rate and regular rhythm.  Pulmonary/Chest: Breath sounds normal. No respiratory distress. She has no wheezes.  Abdominal: Soft. Bowel sounds are normal. There is no tenderness.  Musculoskeletal: She exhibits no edema or tenderness.  Lymphadenopathy:    She has no cervical adenopathy.  Skin: No rash noted. No erythema.  Psychiatric: She has a normal mood and affect. Her behavior is normal.    BP 138/86 (BP Location: Left Arm, Patient Position: Sitting, Cuff Size: Large)   Pulse 78   Temp 97.9 F (36.6 C) (Oral)   Resp 18   Wt (!) 322 lb 6.4 oz (146.2 kg)   SpO2 96%   BMI 51.81 kg/m  Wt Readings from Last 3 Encounters:  09/14/17 (!) 322 lb 6.4 oz (146.2 kg)  02/09/17 (!) 325 lb 3.2 oz (147.5 kg)  06/25/16 (!) 315 lb 9.6 oz (143.2 kg)     Lab Results  Component Value Date   WBC 7.5 09/16/2017   HGB 13.8 09/16/2017   HCT 40.6 09/16/2017   PLT 223 09/16/2017   GLUCOSE 214 (H) 09/16/2017   CHOL 258 (H) 09/16/2017   TRIG 256 (H) 09/16/2017   HDL 65 09/16/2017   LDLCALC 142 (H) 09/16/2017   ALT 28 09/16/2017   AST 22 09/16/2017   NA 141 09/16/2017   K 4.8 09/16/2017   CL 101 09/16/2017   CREATININE 0.69 09/16/2017   BUN 13 09/16/2017   CO2 24 09/16/2017   TSH 2.040  09/16/2017   HGBA1C 8.2 (H) 09/16/2017    Mm Digital Screening Bilateral  Result Date: 02/25/2017 CLINICAL DATA:  Screening. EXAM: DIGITAL SCREENING BILATERAL MAMMOGRAM WITH CAD COMPARISON:  Previous exam(s). ACR Breast Density Category b: There are scattered areas of fibroglandular density. FINDINGS: There are no findings suspicious for malignancy. Images were processed with CAD. IMPRESSION: No mammographic evidence of malignancy. A result letter of this screening mammogram will be mailed directly to the patient. RECOMMENDATION: Screening mammogram in one year. (Code:SM-B-01Y) BI-RADS CATEGORY  1: Negative. Electronically Signed  By: Lillia Mountain M.D.   On: 02/25/2017 09:25       Assessment & Plan:   Problem List Items Addressed This Visit    BMI 50.0-59.9, adult ([place])    Discussed diet and exercise.  Follow.       Diabetes mellitus ([place])    Low carb diet and exercise.  She has started walking.  Check met b and a1c.        Relevant Medications   losartan (COZAAR) 50 MG tablet   Essential hypertension, benign    Blood pressure as outlined.  Will increase losartan to 41m q day.  Follow pressures.  Follow metabolic panel.       Relevant Medications   losartan (COZAAR) 50 MG tablet   GERD (gastroesophageal reflux disease)    Controlled on omeprazole.        Hypercholesterolemia    Low cholesterol diet and exercise.  Check lipid panel.       Relevant Medications   losartan (COZAAR) 50 MG tablet       SEinar Pheasant MD

## 2017-09-16 DIAGNOSIS — I10 Essential (primary) hypertension: Secondary | ICD-10-CM | POA: Diagnosis not present

## 2017-09-16 DIAGNOSIS — E7800 Pure hypercholesterolemia, unspecified: Secondary | ICD-10-CM | POA: Diagnosis not present

## 2017-09-16 DIAGNOSIS — E119 Type 2 diabetes mellitus without complications: Secondary | ICD-10-CM | POA: Diagnosis not present

## 2017-09-17 ENCOUNTER — Telehealth: Payer: Self-pay | Admitting: *Deleted

## 2017-09-17 LAB — HEMOGLOBIN A1C
Est. average glucose Bld gHb Est-mCnc: 189 mg/dL
Hgb A1c MFr Bld: 8.2 % — ABNORMAL HIGH (ref 4.8–5.6)

## 2017-09-17 LAB — CBC WITH DIFFERENTIAL/PLATELET
Basophils Absolute: 0 10*3/uL (ref 0.0–0.2)
Basos: 0 %
EOS (ABSOLUTE): 0.2 10*3/uL (ref 0.0–0.4)
Eos: 3 %
Hematocrit: 40.6 % (ref 34.0–46.6)
Hemoglobin: 13.8 g/dL (ref 11.1–15.9)
Immature Grans (Abs): 0 10*3/uL (ref 0.0–0.1)
Immature Granulocytes: 0 %
Lymphocytes Absolute: 2 10*3/uL (ref 0.7–3.1)
Lymphs: 27 %
MCH: 29.8 pg (ref 26.6–33.0)
MCHC: 34 g/dL (ref 31.5–35.7)
MCV: 88 fL (ref 79–97)
Monocytes Absolute: 0.5 10*3/uL (ref 0.1–0.9)
Monocytes: 7 %
Neutrophils Absolute: 4.7 10*3/uL (ref 1.4–7.0)
Neutrophils: 63 %
Platelets: 223 10*3/uL (ref 150–379)
RBC: 4.63 x10E6/uL (ref 3.77–5.28)
RDW: 14 % (ref 12.3–15.4)
WBC: 7.5 10*3/uL (ref 3.4–10.8)

## 2017-09-17 LAB — BASIC METABOLIC PANEL
BUN/Creatinine Ratio: 19 (ref 9–23)
BUN: 13 mg/dL (ref 6–24)
CO2: 24 mmol/L (ref 20–29)
Calcium: 9.6 mg/dL (ref 8.7–10.2)
Chloride: 101 mmol/L (ref 96–106)
Creatinine, Ser: 0.69 mg/dL (ref 0.57–1.00)
GFR calc Af Amer: 113 mL/min/{1.73_m2} (ref >59)
GFR calc non Af Amer: 98 mL/min/{1.73_m2} (ref >59)
Glucose: 214 mg/dL — ABNORMAL HIGH (ref 65–99)
Potassium: 4.8 mmol/L (ref 3.5–5.2)
Sodium: 141 mmol/L (ref 134–144)

## 2017-09-17 LAB — LIPID PANEL
Chol/HDL Ratio: 4 ratio (ref 0.0–4.4)
Cholesterol, Total: 258 mg/dL — ABNORMAL HIGH (ref 100–199)
HDL: 65 mg/dL (ref >39)
LDL Calculated: 142 mg/dL — ABNORMAL HIGH (ref 0–99)
Triglycerides: 256 mg/dL — ABNORMAL HIGH (ref 0–149)
VLDL Cholesterol Cal: 51 mg/dL — ABNORMAL HIGH (ref 5–40)

## 2017-09-17 LAB — HEPATIC FUNCTION PANEL
ALT: 28 IU/L (ref 0–32)
AST: 22 IU/L (ref 0–40)
Albumin: 4.4 g/dL (ref 3.5–5.5)
Alkaline Phosphatase: 132 IU/L — ABNORMAL HIGH (ref 39–117)
Bilirubin Total: 0.4 mg/dL (ref 0.0–1.2)
Bilirubin, Direct: 0.12 mg/dL (ref 0.00–0.40)
Total Protein: 7.3 g/dL (ref 6.0–8.5)

## 2017-09-17 LAB — TSH: TSH: 2.04 u[IU]/mL (ref 0.450–4.500)

## 2017-09-17 LAB — MICROALBUMIN / CREATININE URINE RATIO
Creatinine, Urine: 156.1 mg/dL
Microalb/Creat Ratio: 58.7 mg/g creat — ABNORMAL HIGH (ref 0.0–30.0)
Microalbumin, Urine: 91.6 ug/mL

## 2017-09-17 MED ORDER — METFORMIN HCL 500 MG PO TABS: 500.0000 mg | ORAL_TABLET | Freq: Two times a day (BID) | ORAL | 1 refills | Status: DC

## 2017-09-17 NOTE — Telephone Encounter (Signed)
-----   Message from Einar Pheasant, MD sent at 09/17/2017  6:00 AM EDT ----- Please call and notify pt that her overall sugar control has increased.  a1c is 8.2.  Given this level, she needs to be on medication.  Have her start metformin 500mg  bid.  Also need to refer her to [facility] for diabetes education and diet instruction. Needs to check blood sugars bid and send in over the next few weeks.   If she has been previously, then would like to get her set up for 2 hour refresher with nutritionist.  If agreeable, let me know and I will place the order.  Also, her cholesterol is elevated.  Given her calculated cholesterol risk, I would like to start her on crestor 10mg  q day.  If agrees to start, will need f/u liver panel checked 6 weeks after starting.  Hgb, thyroid test and kidney function tests are wnl.

## 2017-09-17 NOTE — Telephone Encounter (Signed)
Please see result note for documentation 

## 2017-09-21 ENCOUNTER — Encounter: Payer: Self-pay | Admitting: Internal Medicine

## 2017-09-21 NOTE — Assessment & Plan Note (Signed)
Low carb diet and exercise.  She has started walking.  Check met b and a1c.

## 2017-09-21 NOTE — Assessment & Plan Note (Signed)
Blood pressure as outlined.  Will increase losartan to 50mg  q day.  Follow pressures.  Follow metabolic panel.

## 2017-09-21 NOTE — Assessment & Plan Note (Signed)
Discussed diet and exercise.  Follow.  

## 2017-09-21 NOTE — Assessment & Plan Note (Signed)
Low cholesterol diet and exercise.  Check lipid panel.   

## 2017-09-21 NOTE — Assessment & Plan Note (Signed)
Controlled on omeprazole.   

## 2017-10-22 ENCOUNTER — Encounter: Payer: Self-pay | Admitting: Internal Medicine

## 2017-10-23 MED ORDER — METFORMIN HCL 1000 MG PO TABS: 1000.0000 mg | ORAL_TABLET | Freq: Two times a day (BID) | ORAL | 2 refills | Status: DC

## 2017-10-23 NOTE — Telephone Encounter (Signed)
rx sent in for metformin 1000mg  bid #60 with 2 refills.

## 2017-11-24 ENCOUNTER — Encounter: Payer: Self-pay | Admitting: Internal Medicine

## 2017-11-24 MED ORDER — LOSARTAN POTASSIUM 50 MG PO TABS: 50.0000 mg | ORAL_TABLET | Freq: Every day | ORAL | 1 refills | Status: DC

## 2017-11-24 NOTE — Telephone Encounter (Signed)
rx sent in for losartan

## 2017-12-06 ENCOUNTER — Encounter: Payer: Self-pay | Admitting: Internal Medicine

## 2017-12-06 ENCOUNTER — Ambulatory Visit: Payer: 59 | Admitting: Internal Medicine

## 2017-12-06 DIAGNOSIS — I10 Essential (primary) hypertension: Secondary | ICD-10-CM

## 2017-12-06 DIAGNOSIS — R109 Unspecified abdominal pain: Secondary | ICD-10-CM

## 2017-12-06 DIAGNOSIS — E7800 Pure hypercholesterolemia, unspecified: Secondary | ICD-10-CM | POA: Diagnosis not present

## 2017-12-06 DIAGNOSIS — E119 Type 2 diabetes mellitus without complications: Secondary | ICD-10-CM | POA: Diagnosis not present

## 2017-12-06 DIAGNOSIS — K219 Gastro-esophageal reflux disease without esophagitis: Secondary | ICD-10-CM

## 2017-12-06 MED ORDER — METFORMIN HCL 1000 MG PO TABS: 1000.0000 mg | ORAL_TABLET | Freq: Two times a day (BID) | ORAL | 2 refills | Status: DC

## 2017-12-06 MED ORDER — LOSARTAN POTASSIUM 50 MG PO TABS: 50.0000 mg | ORAL_TABLET | Freq: Every day | ORAL | 1 refills | Status: DC

## 2017-12-06 NOTE — Progress Notes (Signed)
Patient ID: Wendy Callahan, female   DOB: [birth year], 58 y.o.   MRN: [id number]   Subjective:    Patient ID: Wendy Callahan, female    DOB: 09-23-1960, 57 y.o.   MRN: [id number]  HPI  Patient here for a scheduled follow up.  She reports she is doing relatively well.  Is trying to adjust her diet.  Has lost weight.  Discussed diet and exercise.  No chest pain.  No sob.  No acid reflux.  Does report that this past weekend she started having abdominal pain.  States the pain had the same intensity as kidney stone pain, but not the same type of pain.  Has a "hernia".  Noticed increased fullness and tenderness over her mid abdomen.  Had some associated nausea.  Stomach felt tight.  No vomiting.  Bowels moving.  No diarrhea.  Pain has resolved now.  Eating and drinking normally.  No fever.     Past Medical History:  Diagnosis Date  . Allergy   . Arthritis   . Environmental allergies   . GERD (gastroesophageal reflux disease)   . Hypercholesterolemia   . Hypertension    Past Surgical History:  Procedure Laterality Date  . BLADDER SURGERY  1973  . TUBAL LIGATION  10/1991   Family History  Adopted: Yes  Problem Relation Age of Onset  . Congenital heart disease Son        Tetrology of fallot   Social History   Socioeconomic History  . Marital status: Married    Spouse name: Not on file  . Number of children: 2  . Years of education: Not on file  . Highest education level: Not on file  Occupational History  . Occupation: Scientist, research (physical sciences): [employer]    Comment: [place]  . Financial resource strain: Not on file  . Food insecurity:    Worry: Not on file    Inability: Not on file  . Transportation needs:    Medical: Not on file    Non-medical: Not on file  Tobacco Use  . Smoking status: Never Smoker  . Smokeless tobacco: Never Used  Substance and Sexual Activity  . Alcohol use: Yes    Alcohol/week: 0.0 oz    Comment: occasional  .  Drug use: No  . Sexual activity: Not on file  Lifestyle  . Physical activity:    Days per week: Not on file    Minutes per session: Not on file  . Stress: Not on file  Relationships  . Social connections:    Talks on phone: Not on file    Gets together: Not on file    Attends religious service: Not on file    Active member of club or organization: Not on file    Attends meetings of clubs or organizations: Not on file    Relationship status: Not on file  Other Topics Concern  . Not on file  Social History Narrative  . Not on file    Outpatient Encounter Medications as of 12/06/2017  Medication Sig  . Acetaminophen (TYLENOL ARTHRITIS PAIN PO) Take by mouth 2 (two) times daily.  Marland Kitchen acyclovir ointment (ZOVIRAX) 5 % Apply topical as directed  . fexofenadine (ALLEGRA) 180 MG tablet Take 180 mg by mouth daily.  Marland Kitchen losartan (COZAAR) 50 MG tablet Take 1 tablet (50 mg total) by mouth daily.  . metFORMIN (GLUCOPHAGE) 1000 MG tablet Take 1 tablet (1,000 mg total) by  mouth 2 (two) times daily with a meal.  . omeprazole (PRILOSEC) 20 MG capsule Take 20 mg by mouth daily.  . pimecrolimus (ELIDEL) 1 % cream Apply topically 2 (two) times daily as needed.  . [DISCONTINUED] losartan (COZAAR) 50 MG tablet Take 1 tablet (50 mg total) by mouth daily.  . [DISCONTINUED] metFORMIN (GLUCOPHAGE) 1000 MG tablet Take 1 tablet (1,000 mg total) by mouth 2 (two) times daily with a meal.   No facility-administered encounter medications on file as of 12/06/2017.     Review of Systems  Constitutional: Negative for appetite change and fever.  HENT: Negative for congestion and sinus pressure.   Respiratory: Negative for cough, chest tightness and shortness of breath.   Cardiovascular: Negative for chest pain, palpitations and leg swelling.  Gastrointestinal: Negative for diarrhea and vomiting.       Previous abdominal pain as outlined.  Previous nausea.   Genitourinary: Negative for difficulty urinating and  dysuria.  Musculoskeletal: Negative for joint swelling and myalgias.  Skin: Negative for color change and rash.  Neurological: Negative for dizziness, light-headedness and headaches.  Psychiatric/Behavioral: Negative for agitation and dysphoric mood.       Objective:    Physical Exam  Constitutional: She appears well-developed and well-nourished. No distress.  HENT:  Nose: Nose normal.  Mouth/Throat: Oropharynx is clear and moist.  Neck: Neck supple. No thyromegaly present.  Cardiovascular: Normal rate and regular rhythm.  Pulmonary/Chest: Breath sounds normal. No respiratory distress. She has no wheezes.  Abdominal: Soft. Bowel sounds are normal. There is no tenderness.  Increased protuberance - mid abdomen - with standing.  No significant pain with palpation.    Musculoskeletal: She exhibits no edema or tenderness.  Lymphadenopathy:    She has no cervical adenopathy.  Skin: No rash noted. No erythema.  Psychiatric: She has a normal mood and affect. Her behavior is normal.    BP 128/80 (BP Location: Left Arm, Patient Position: Sitting, Cuff Size: Large)   Pulse 77   Temp 98.1 F (36.7 C) (Oral)   Resp 18   Wt (!) 307 lb (139.3 kg)   SpO2 97%   BMI 49.34 kg/m  Wt Readings from Last 3 Encounters:  12/06/17 (!) 307 lb (139.3 kg)  09/14/17 (!) 322 lb 6.4 oz (146.2 kg)  02/09/17 (!) 325 lb 3.2 oz (147.5 kg)     Lab Results  Component Value Date   WBC 7.5 09/16/2017   HGB 13.8 09/16/2017   HCT 40.6 09/16/2017   PLT 223 09/16/2017   GLUCOSE 214 (H) 09/16/2017   CHOL 258 (H) 09/16/2017   TRIG 256 (H) 09/16/2017   HDL 65 09/16/2017   LDLCALC 142 (H) 09/16/2017   ALT 28 09/16/2017   AST 22 09/16/2017   NA 141 09/16/2017   K 4.8 09/16/2017   CL 101 09/16/2017   CREATININE 0.69 09/16/2017   BUN 13 09/16/2017   CO2 24 09/16/2017   TSH 2.040 09/16/2017   HGBA1C 8.2 (H) 09/16/2017    Mm Digital Screening Bilateral  Result Date: 02/25/2017 CLINICAL DATA:   Screening. EXAM: DIGITAL SCREENING BILATERAL MAMMOGRAM WITH CAD COMPARISON:  Previous exam(s). ACR Breast Density Category b: There are scattered areas of fibroglandular density. FINDINGS: There are no findings suspicious for malignancy. Images were processed with CAD. IMPRESSION: No mammographic evidence of malignancy. A result letter of this screening mammogram will be mailed directly to the patient. RECOMMENDATION: Screening mammogram in one year. (Code:SM-B-01Y) BI-RADS CATEGORY  1: Negative. Electronically Signed  By: Lillia Mountain M.D.   On: 02/25/2017 09:25       Assessment & Plan:   Problem List Items Addressed This Visit    Abdominal pain    Episode of increased abdominal pain and fullness (tight feeling).  She is concern over possible hernia.  Increased fullness when standing.  No pain now.  Discussed CT abdomin.  She wants to hold on scanning.  Request referral to surgery for further evaluation.       Relevant Orders   Ambulatory referral to General Surgery   Diabetes mellitus ([place])    Low carb diet and exercise.  On metformin.  Sugar checks reviewed.  Recent sugars averaging - 130-160s.  Discussed diet and exercise.  continue weight loss.        Relevant Medications   metFORMIN (GLUCOPHAGE) 1000 MG tablet   losartan (COZAAR) 50 MG tablet   Other Relevant Orders   Hemoglobin A1c   Essential hypertension, benign    Blood pressure under good control.  Continue same medication regimen.  Follow pressures.  Follow metabolic panel.        Relevant Medications   losartan (COZAAR) 50 MG tablet   Other Relevant Orders   Basic metabolic panel   GERD (gastroesophageal reflux disease)    Controlled on omeprazole.        Hypercholesterolemia    Low cholesterol diet and exercise.  Follow lipid panel.        Relevant Medications   losartan (COZAAR) 50 MG tablet   Other Relevant Orders   Hepatic function panel   Lipid panel       Einar Pheasant, MD

## 2017-12-08 ENCOUNTER — Encounter: Payer: Self-pay | Admitting: Internal Medicine

## 2017-12-08 DIAGNOSIS — R109 Unspecified abdominal pain: Secondary | ICD-10-CM | POA: Insufficient documentation

## 2017-12-08 NOTE — Assessment & Plan Note (Signed)
Low cholesterol diet and exercise.  Follow lipid panel.   

## 2017-12-08 NOTE — Assessment & Plan Note (Signed)
Controlled on omeprazole.   

## 2017-12-08 NOTE — Assessment & Plan Note (Signed)
Blood pressure under good control.  Continue same medication regimen.  Follow pressures.  Follow metabolic panel.   

## 2017-12-08 NOTE — Assessment & Plan Note (Signed)
Low carb diet and exercise.  On metformin.  Sugar checks reviewed.  Recent sugars averaging - 130-160s.  Discussed diet and exercise.  continue weight loss.

## 2017-12-08 NOTE — Assessment & Plan Note (Signed)
Episode of increased abdominal pain and fullness (tight feeling).  She is concern over possible hernia.  Increased fullness when standing.  No pain now.  Discussed CT abdomin.  She wants to hold on scanning.  Request referral to surgery for further evaluation.

## 2017-12-09 ENCOUNTER — Encounter: Payer: Self-pay | Admitting: General Surgery

## 2018-01-06 ENCOUNTER — Ambulatory Visit: Payer: 59 | Admitting: General Surgery

## 2018-01-06 ENCOUNTER — Encounter: Payer: Self-pay | Admitting: General Surgery

## 2018-01-06 VITALS — BP 146/88 | HR 78 | Resp 16 | Ht 66.0 in | Wt 298.0 lb

## 2018-01-06 DIAGNOSIS — K439 Ventral hernia without obstruction or gangrene: Secondary | ICD-10-CM | POA: Diagnosis not present

## 2018-01-06 DIAGNOSIS — R1084 Generalized abdominal pain: Secondary | ICD-10-CM

## 2018-01-06 NOTE — Patient Instructions (Addendum)
The patient is aware to call back for any questions or new concerns.  Abdominal ultrasound to evaluate abdominal pain   Hernia, Adult A hernia is the bulging of an organ or tissue through a weak spot in the muscles of the abdomen (abdominal wall). Hernias develop most often near the navel or groin. There are many kinds of hernias. Common kinds include:  Femoral hernia. This kind of hernia develops under the groin in the upper thigh area.  Inguinal hernia. This kind of hernia develops in the groin or scrotum.  Umbilical hernia. This kind of hernia develops near the navel.  Hiatal hernia. This kind of hernia causes part of the stomach to be pushed up into the chest.  Incisional hernia. This kind of hernia bulges through a scar from an abdominal surgery.  What are the causes? This condition may be caused by:  Heavy lifting.  Coughing over a long period of time.  Straining to have a bowel movement.  An incision made during an abdominal surgery.  A birth defect (congenital defect).  Excess weight or obesity.  Smoking.  Poor nutrition.  Cystic fibrosis.  Excess fluid in the abdomen.  Undescended testicles.  What are the signs or symptoms? Symptoms of a hernia include:  A lump on the abdomen. This is the first sign of a hernia. The lump may become more obvious with standing, straining, or coughing. It may get bigger over time if it is not treated or if the condition causing it is not treated.  Pain. A hernia is usually painless, but it may become painful over time if treatment is delayed. The pain is usually dull and may get worse with standing or lifting heavy objects.  Sometimes a hernia gets tightly squeezed in the weak spot (strangulated) or stuck there (incarcerated) and causes additional symptoms. These symptoms may include:  Vomiting.  Nausea.  Constipation.  Irritability.  How is this diagnosed? A hernia may be diagnosed with:  A physical exam. During  the exam your health care provider may ask you to cough or to make a specific movement, because a hernia is usually more visible when you move.  Imaging tests. These can include: ? X-rays. ? Ultrasound. ? CT scan.  How is this treated? A hernia that is small and painless may not need to be treated. A hernia that is large or painful may be treated with surgery. Inguinal hernias may be treated with surgery to prevent incarceration or strangulation. Strangulated hernias are always treated with surgery, because lack of blood to the trapped organ or tissue can cause it to die. Surgery to treat a hernia involves pushing the bulge back into place and repairing the weak part of the abdomen. Follow these instructions at home:  Avoid straining.  Do not lift anything heavier than 10 lb (4.5 kg).  Lift with your leg muscles, not your back muscles. This helps avoid strain.  When coughing, try to cough gently.  Prevent constipation. Constipation leads to straining with bowel movements, which can make a hernia worse or cause a hernia repair to break down. You can prevent constipation by: ? Eating a high-fiber diet that includes plenty of fruits and vegetables. ? Drinking enough fluids to keep your urine clear or pale yellow. Aim to drink 6-8 glasses of water per day. ? Using a stool softener as directed by your health care provider.  Lose weight, if you are overweight.  Do not use any tobacco products, including cigarettes, chewing tobacco, or electronic  cigarettes. If you need help quitting, ask your health care provider.  Keep all follow-up visits as directed by your health care provider. This is important. Your health care provider may need to monitor your condition. Contact a health care provider if:  You have swelling, redness, and pain in the affected area.  Your bowel habits change. Get help right away if:  You have a fever.  You have abdominal pain that is getting worse.  You feel  nauseous or you vomit.  You cannot push the hernia back in place by gently pressing on it while you are lying down.  The hernia: ? Changes in shape or size. ? Is stuck outside the abdomen. ? Becomes discolored. ? Feels hard or tender. This information is not intended to replace advice given to you by your health care provider. Make sure you discuss any questions you have with your health care provider. Document Released: 05/04/2005 Document Revised: 10/02/2015 Document Reviewed: 03/14/2014 Elsevier Interactive Patient Education  2017 [employer]   Laparoscopic Cholecystectomy Laparoscopic cholecystectomy is surgery to remove the gallbladder. The gallbladder is a pear-shaped organ that lies beneath the liver on the right side of the body. The gallbladder stores bile, which is a fluid that helps the body to digest fats. Cholecystectomy is often done for inflammation of the gallbladder (cholecystitis). This condition is usually caused by a buildup of gallstones (cholelithiasis) in the gallbladder. Gallstones can block the flow of bile, which can result in inflammation and pain. In severe cases, emergency surgery may be required. This procedure is done though small incisions in your abdomen (laparoscopic surgery). A thin scope with a camera (laparoscope) is inserted through one incision. Thin surgical instruments are inserted through the other incisions. In some cases, a laparoscopic procedure may be turned into a type of surgery that is done through a larger incision (open surgery). Tell a health care provider about:  Any allergies you have.  All medicines you are taking, including vitamins, herbs, eye drops, creams, and over-the-counter medicines.  Any problems you or family members have had with anesthetic medicines.  Any blood disorders you have.  Any surgeries you have had.  Any medical conditions you have.  Whether you are pregnant or may be pregnant. What are the  risks? Generally, this is a safe procedure. However, problems may occur, including:  Infection.  Bleeding.  Allergic reactions to medicines.  Damage to other structures or organs.  A stone remaining in the common bile duct. The common bile duct carries bile from the gallbladder into the small intestine.  A bile leak from the cyst duct that is clipped when your gallbladder is removed.  What happens before the procedure? Staying hydrated Follow instructions from your health care provider about hydration, which may include:  Up to 2 hours before the procedure - you may continue to drink clear liquids, such as water, clear fruit juice, black coffee, and plain tea.  Eating and drinking restrictions Follow instructions from your health care provider about eating and drinking, which may include:  8 hours before the procedure - stop eating heavy meals or foods such as meat, fried foods, or fatty foods.  6 hours before the procedure - stop eating light meals or foods, such as toast or cereal.  6 hours before the procedure - stop drinking milk or drinks that contain milk.  2 hours before the procedure - stop drinking clear liquids.  Medicines  Ask your health care provider about: ? Changing or stopping your  regular medicines. This is especially important if you are taking diabetes medicines or blood thinners. ? Taking medicines such as aspirin and ibuprofen. These medicines can thin your blood. Do not take these medicines before your procedure if your health care provider instructs you not to.  You may be given antibiotic medicine to help prevent infection. General instructions  Let your health care provider know if you develop a cold or an infection before surgery.  Plan to have someone take you home from the hospital or clinic.  Ask your health care provider how your surgical site will be marked or identified. What happens during the procedure?  To reduce your risk of  infection: ? Your health care team will wash or sanitize their hands. ? Your skin will be washed with soap. ? Hair may be removed from the surgical area.  An IV tube may be inserted into one of your veins.  You will be given one or more of the following: ? A medicine to help you relax (sedative). ? A medicine to make you fall asleep (general anesthetic).  A breathing tube will be placed in your mouth.  Your surgeon will make several small cuts (incisions) in your abdomen.  The laparoscope will be inserted through one of the small incisions. The camera on the laparoscope will send images to a TV screen (monitor) in the operating room. This lets your surgeon see inside your abdomen.  Air-like gas will be pumped into your abdomen. This will expand your abdomen to give the surgeon more room to perform the surgery.  Other tools that are needed for the procedure will be inserted through the other incisions. The gallbladder will be removed through one of the incisions.  Your common bile duct may be examined. If stones are found in the common bile duct, they may be removed.  After your gallbladder has been removed, the incisions will be closed with stitches (sutures), staples, or skin glue.  Your incisions may be covered with a bandage (dressing). The procedure may vary among health care providers and hospitals. What happens after the procedure?  Your blood pressure, heart rate, breathing rate, and blood oxygen level will be monitored until the medicines you were given have worn off.  You will be given medicines as needed to control your pain.  Do not drive for 24 hours if you were given a sedative. This information is not intended to replace advice given to you by your health care provider. Make sure you discuss any questions you have with your health care provider. Document Released: 05/04/2005 Document Revised: 11/24/2015 Document Reviewed: 10/21/2015 Elsevier Interactive Patient  Education  [employer].  The patient is scheduled for an abdominal ultrasound complete at [facility] on 01/13/18 at 9:30 am. She will arrive by 9:15 am and have nothing to eat or drink after midnight the night prior.

## 2018-01-06 NOTE — Progress Notes (Signed)
Patient ID: Wendy Callahan, female   DOB: 1960-11-16, 57 y.o.   MRN: [id number]  Chief Complaint  Patient presents with  . Abdominal Pain    HPI Wendy Callahan is a 57 y.o. female.  Here for evaluation of abdominal pain and hernia referred by Dr Nicki Reaper. She states she has only one episode of central pain on a Saturday July 20th. The extreme pain lasted 4-5 hours, uncomfortable that evening and by morning it was gone.. No nausea or vomiting. No further pain. She states the hernia is to the right of her belly button has been there for over 5 years. She feels like it is more prominent and visible through her clothes.  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  HPI  Past Medical History:  Diagnosis Date  . Allergy   . Arthritis   . Environmental allergies   . GERD (gastroesophageal reflux disease)   . Hypercholesterolemia   . Hypertension   . Murmur     Past Surgical History:  Procedure Laterality Date  . BLADDER SURGERY  1973  . COLONOSCOPY  2014   Dr Vira Agar  . TUBAL LIGATION  10/1991  . UPPER GI ENDOSCOPY  2015   Dr Vira Agar    Family History  Adopted: Yes  Problem Relation Age of Onset  . Congenital heart disease Son        Tetrology of fallot    Social History Social History   Tobacco Use  . Smoking status: Never Smoker  . Smokeless tobacco: Never Used  Substance Use Topics  . Alcohol use: Yes    Alcohol/week: 0.0 standard drinks    Comment: occasional  . Drug use: No    Allergies  Allergen Reactions  . Tetracyclines & Related Other (See Comments)    Yeast infection    Current Outpatient Medications  Medication Sig Dispense Refill  . Acetaminophen (TYLENOL ARTHRITIS PAIN PO) Take by mouth 2 (two) times daily.    Marland Kitchen acyclovir ointment (ZOVIRAX) 5 % Apply topical as directed 15 g 0  . fexofenadine (ALLEGRA) 180 MG tablet Take 180 mg by mouth daily.    Marland Kitchen losartan (COZAAR) 50 MG tablet Take 1 tablet (50 mg total) by mouth daily. 30 tablet 1  .  metFORMIN (GLUCOPHAGE) 1000 MG tablet Take 1 tablet (1,000 mg total) by mouth 2 (two) times daily with a meal. 60 tablet 2  . omeprazole (PRILOSEC) 20 MG capsule Take 20 mg by mouth daily.    . pimecrolimus (ELIDEL) 1 % cream Apply topically 2 (two) times daily as needed. 60 g 0   No current facility-administered medications for this visit.     Review of Systems Review of Systems  Constitutional: Negative.   Respiratory: Negative.   Cardiovascular: Negative.   Gastrointestinal: Negative for diarrhea and nausea.    Blood pressure (!) 146/88, pulse 78, resp. rate 16, height 5\' 6"  (1.676 m), weight 298 lb (135.2 kg), last menstrual period 05/18/2016, SpO2 98 %.  Physical Exam Physical Exam  Constitutional: She is oriented to person, place, and time. She appears well-developed and well-nourished.  HENT:  Mouth/Throat: Oropharynx is clear and moist.  Eyes: Conjunctivae are normal. No scleral icterus.  Neck: Neck supple.  Cardiovascular: Normal rate, regular rhythm and intact distal pulses.  Murmur heard.  Systolic murmur is present with a grade of 2/6. Pulses:      Femoral pulses are 2+ on the right side, and 2+ on the left side. No lower leg edema.  Pulmonary/Chest: Effort normal and breath sounds normal.  Abdominal: Soft. Normal appearance and bowel sounds are normal. A hernia is present. Hernia confirmed positive in the ventral area.    Mild skin irritation.   Lymphadenopathy:    She has no cervical adenopathy.  Neurological: She is alert and oriented to person, place, and time.  Skin: Skin is warm and dry.  Psychiatric: Her behavior is normal.    Data Reviewed Laboratory studies dated May 2, 2 thousand 19 showed a random blood sugar of 214, normal electrolytes, creatinine 0.69 with an estimated GFR of 98.  Liver function studies notable for a mild elevation of the alkaline phosphatase of 132.  Normal transaminases.  Normal bilirubin.  Significantly elevated hemoglobin  A1c at 8.2.  Normal CBC with a hemoglobin of 13.8, MCV of 88, white blood cell count of 7500 with normal differential and a platelet count of 223,000.  Assessment    Ventral hernia with partially incarcerated omentum.  Single episode of severe abdominal pain without prodrome or recurrence.  Potential source biliary tree based on age, gender, obesity and parity.  Poorly controlled diabetes    Plan    Abdominal ultrasound to evaluate abdominal pain possible gall stones.  Where these to be identified, she would likely benefit from elective cholecystectomy at time of ventral hernia repair.  Better glucose control would minimize her risk for surgery and infectious complications related to hernia repair, especially of mesh was required.  Hernia precautions and incarceration were discussed with the patient. If they develop symptoms of an incarcerated hernia, they were encouraged to seek prompt medical attention.  The role of prosthetic mesh and hernia repair if a significant defect is appreciated was discussed.  This would be a "game day decision".  Patient is amenable to mesh placement if indicated.    Laparoscopic Cholecystectomy with Intraoperative Cholangiogram. The procedure, including it's potential risks and complications (including but not limited to infection, bleeding, injury to intra-abdominal organs or bile ducts, bile leak, poor cosmetic result, sepsis and death) were discussed with the patient in detail. Non-operative options, including their inherent risks (acute calculous cholecystitis with possible choledocholithiasis or gallstone pancreatitis, with the risk of ascending cholangitis, sepsis, and death) were discussed as well. The patient expressed and understanding of what we discussed and wishes to proceed with laparoscopic cholecystectomy. The patient further understands that if it is technically not possible, or it is unsafe to proceed laparoscopically, that I will convert to an  open cholecystectomy.  Plan to be out of work 1-2 weeks.      HPI, Physical Exam, Assessment and Plan have been scribed under the direction and in the presence of Robert Bellow, MD. Karie Fetch, RN  The patient is scheduled for an abdominal ultrasound complete at [facility] on 01/13/18 at 9:30 am. She will arrive by 9:15 am and have nothing to eat or drink after midnight the night prior. Documented by Caryl-Lyn Otis Brace LPN  Wendy Callahan 01/07/2018, 6:35 AM

## 2018-01-07 DIAGNOSIS — K439 Ventral hernia without obstruction or gangrene: Secondary | ICD-10-CM | POA: Insufficient documentation

## 2018-01-13 ENCOUNTER — Ambulatory Visit
Admission: RE | Admit: 2018-01-13 | Discharge: 2018-01-13 | Disposition: A | Discharge status: Home / Self Care | Payer: 59 | Source: Ambulatory Visit | Attending: General Surgery | Admitting: General Surgery

## 2018-01-13 DIAGNOSIS — K439 Ventral hernia without obstruction or gangrene: Secondary | ICD-10-CM | POA: Insufficient documentation

## 2018-01-13 DIAGNOSIS — K760 Fatty (change of) liver, not elsewhere classified: Secondary | ICD-10-CM | POA: Diagnosis not present

## 2018-01-13 DIAGNOSIS — R1084 Generalized abdominal pain: Secondary | ICD-10-CM | POA: Insufficient documentation

## 2018-01-14 ENCOUNTER — Telehealth: Payer: Self-pay | Admitting: General Surgery

## 2018-01-14 NOTE — Telephone Encounter (Signed)
Message left that the abdominal ultrasound was negative.  Her only struggle at this time is her ventral hernia with incarcerated omentum and poor glucose control.

## 2018-01-18 ENCOUNTER — Telehealth: Payer: Self-pay | Admitting: General Surgery

## 2018-01-18 NOTE — Telephone Encounter (Signed)
Please call pt and see how her sugars are doing.  Confirm she is taking metformin as prescribed.

## 2018-01-18 NOTE — Telephone Encounter (Signed)
Patient is calling to get her results on her U/S and is asking what her next step will need to be. Please call patient and advise.

## 2018-01-18 NOTE — Telephone Encounter (Signed)
Notified patient as instructed, patient pleased. She states she has been working to get her levels down and that she has repeat labs on 01-27-18. She is aware that the level needs to be trending downward to be able to have surgery, pt agrees and will call back.

## 2018-01-18 NOTE — Telephone Encounter (Signed)
LMTCB

## 2018-01-18 NOTE — Telephone Encounter (Signed)
Patient called for results on ultrasound completed last week.  I left a message on her phone on January 14, 2018 reporting that the ultrasound was normal.  Surgery can be scheduled when her glucose is under better control, recently her hemoglobin A1c went from 6.9-8.2.  The increased risk of postoperative infection warrants delay pending better glucose control.

## 2018-01-20 DIAGNOSIS — D2261 Melanocytic nevi of right upper limb, including shoulder: Secondary | ICD-10-CM | POA: Diagnosis not present

## 2018-01-20 DIAGNOSIS — Q825 Congenital non-neoplastic nevus: Secondary | ICD-10-CM | POA: Diagnosis not present

## 2018-01-20 DIAGNOSIS — D225 Melanocytic nevi of trunk: Secondary | ICD-10-CM | POA: Diagnosis not present

## 2018-01-27 ENCOUNTER — Telehealth: Payer: Self-pay

## 2018-01-27 ENCOUNTER — Other Ambulatory Visit: Payer: Self-pay | Admitting: Internal Medicine

## 2018-01-27 DIAGNOSIS — E119 Type 2 diabetes mellitus without complications: Secondary | ICD-10-CM | POA: Diagnosis not present

## 2018-01-27 DIAGNOSIS — I10 Essential (primary) hypertension: Secondary | ICD-10-CM | POA: Diagnosis not present

## 2018-01-27 DIAGNOSIS — E7800 Pure hypercholesterolemia, unspecified: Secondary | ICD-10-CM | POA: Diagnosis not present

## 2018-01-27 NOTE — Telephone Encounter (Signed)
Copied from [place] [id number]. Topic: General - Other >> Jan 27, 2018  8:15 AM Yvette Rack wrote: Reason for CRM: pt calling stating that she was waiting on lab orders to be faxed to [facility] but they never received the orders the fax number [phone] >> Jan 27, 2018  8:18 AM Yvette Rack wrote: Pt is at [facility] now  Lab orders have been faxed to the number listed above.

## 2018-01-28 ENCOUNTER — Other Ambulatory Visit: Payer: Self-pay | Admitting: Internal Medicine

## 2018-01-28 LAB — BASIC METABOLIC PANEL
BUN/Creatinine Ratio: 19 (ref 9–23)
BUN: 12 mg/dL (ref 6–24)
CO2: 25 mmol/L (ref 20–29)
Calcium: 9.8 mg/dL (ref 8.7–10.2)
Chloride: 100 mmol/L (ref 96–106)
Creatinine, Ser: 0.62 mg/dL (ref 0.57–1.00)
GFR calc Af Amer: 117 mL/min/{1.73_m2} (ref >59)
GFR calc non Af Amer: 102 mL/min/{1.73_m2} (ref >59)
Glucose: 138 mg/dL — ABNORMAL HIGH (ref 65–99)
Potassium: 5 mmol/L (ref 3.5–5.2)
Sodium: 140 mmol/L (ref 134–144)

## 2018-01-28 LAB — LIPID PANEL W/O CHOL/HDL RATIO
Cholesterol, Total: 212 mg/dL — ABNORMAL HIGH (ref 100–199)
HDL: 61 mg/dL (ref >39)
LDL Calculated: 111 mg/dL — ABNORMAL HIGH (ref 0–99)
Triglycerides: 200 mg/dL — ABNORMAL HIGH (ref 0–149)
VLDL Cholesterol Cal: 40 mg/dL (ref 5–40)

## 2018-01-28 LAB — HEPATIC FUNCTION PANEL
ALT: 17 IU/L (ref 0–32)
AST: 15 IU/L (ref 0–40)
Albumin: 4.4 g/dL (ref 3.5–5.5)
Alkaline Phosphatase: 111 IU/L (ref 39–117)
Bilirubin Total: 0.2 mg/dL (ref 0.0–1.2)
Bilirubin, Direct: 0.09 mg/dL (ref 0.00–0.40)
Total Protein: 6.9 g/dL (ref 6.0–8.5)

## 2018-01-28 LAB — HGB A1C W/O EAG: Hgb A1c MFr Bld: 6.5 % — ABNORMAL HIGH (ref 4.8–5.6)

## 2018-01-28 NOTE — Progress Notes (Signed)
Opened in error

## 2018-01-31 ENCOUNTER — Encounter: Payer: Self-pay | Admitting: Internal Medicine

## 2018-02-01 ENCOUNTER — Ambulatory Visit: Payer: 59 | Admitting: Internal Medicine

## 2018-02-04 ENCOUNTER — Encounter: Payer: Self-pay | Admitting: Internal Medicine

## 2018-02-04 ENCOUNTER — Ambulatory Visit: Payer: 59 | Admitting: Internal Medicine

## 2018-02-04 VITALS — BP 132/78 | HR 71 | Temp 98.0°F | Resp 18 | Wt 297.4 lb

## 2018-02-04 DIAGNOSIS — I10 Essential (primary) hypertension: Secondary | ICD-10-CM

## 2018-02-04 DIAGNOSIS — R109 Unspecified abdominal pain: Secondary | ICD-10-CM | POA: Diagnosis not present

## 2018-02-04 DIAGNOSIS — K219 Gastro-esophageal reflux disease without esophagitis: Secondary | ICD-10-CM

## 2018-02-04 DIAGNOSIS — Z23 Encounter for immunization: Secondary | ICD-10-CM | POA: Diagnosis not present

## 2018-02-04 DIAGNOSIS — K439 Ventral hernia without obstruction or gangrene: Secondary | ICD-10-CM

## 2018-02-04 DIAGNOSIS — Z1231 Encounter for screening mammogram for malignant neoplasm of breast: Secondary | ICD-10-CM

## 2018-02-04 DIAGNOSIS — E119 Type 2 diabetes mellitus without complications: Secondary | ICD-10-CM

## 2018-02-04 DIAGNOSIS — Z01818 Encounter for other preprocedural examination: Secondary | ICD-10-CM

## 2018-02-04 DIAGNOSIS — Z1239 Encounter for other screening for malignant neoplasm of breast: Secondary | ICD-10-CM

## 2018-02-04 NOTE — Telephone Encounter (Signed)
Patient seen in office today. Taking metformin as directed

## 2018-02-04 NOTE — Progress Notes (Signed)
Patient ID: Wendy Callahan, female   DOB: June 01, 1960, 57 y.o.   MRN: [id number]   Subjective:    Patient ID: Wendy Callahan, female    DOB: 1961-01-17, 57 y.o.   MRN: [id number]  HPI  Patient here for a scheduled follow up and to discuss plans for upcoming surgery.  Has ventral hernia with partially incarcerated omentum.  No pain since the one episode in July.  Saw Dr Bary Castilla.  Plans for ventral hernia repair. Had to get sugars under better control first.  States she has been doing well.  Has adjusted her diet.  Lost weight.  Feels better.  No chest pain.  No sob.  No acid reflux.  No abdominal pain.  Bowels moving.  Is walking 3 days per week.  Overall feels good.  Discussed labs. a1c improved - 6.5.     Past Medical History:  Diagnosis Date  . Allergy   . Arthritis   . Environmental allergies   . GERD (gastroesophageal reflux disease)   . Hypercholesterolemia   . Hypertension   . Murmur    Past Surgical History:  Procedure Laterality Date  . BLADDER SURGERY  1973  . COLONOSCOPY  2014   Dr Vira Agar  . TUBAL LIGATION  10/1991  . UPPER GI ENDOSCOPY  2015   Dr Vira Agar   Family History  Adopted: Yes  Problem Relation Age of Onset  . Congenital heart disease Son        Tetrology of fallot   Social History   Socioeconomic History  . Marital status: Married    Spouse name: Not on file  . Number of children: 2  . Years of education: Not on file  . Highest education level: Not on file  Occupational History  . Occupation: Scientist, research (physical sciences): [employer]    Comment: [employer]  . Financial resource strain: Not on file  . Food insecurity:    Worry: Not on file    Inability: Not on file  . Transportation needs:    Medical: Not on file    Non-medical: Not on file  Tobacco Use  . Smoking status: Never Smoker  . Smokeless tobacco: Never Used  Substance and Sexual Activity  . Alcohol use: Yes    Alcohol/week: 0.0 standard drinks   Comment: occasional  . Drug use: No  . Sexual activity: Not on file  Lifestyle  . Physical activity:    Days per week: Not on file    Minutes per session: Not on file  . Stress: Not on file  Relationships  . Social connections:    Talks on phone: Not on file    Gets together: Not on file    Attends religious service: Not on file    Active member of club or organization: Not on file    Attends meetings of clubs or organizations: Not on file    Relationship status: Not on file  Other Topics Concern  . Not on file  Social History Narrative  . Not on file    Outpatient Encounter Medications as of 02/04/2018  Medication Sig  . Acetaminophen (TYLENOL ARTHRITIS PAIN PO) Take by mouth 2 (two) times daily.  Marland Kitchen acyclovir ointment (ZOVIRAX) 5 % Apply topical as directed  . fexofenadine (ALLEGRA) 180 MG tablet Take 180 mg by mouth daily.  Marland Kitchen losartan (COZAAR) 50 MG tablet Take 1 tablet (50 mg total) by mouth daily.  . metFORMIN (GLUCOPHAGE) 1000 MG  tablet Take 1 tablet (1,000 mg total) by mouth 2 (two) times daily with a meal.  . omeprazole (PRILOSEC) 20 MG capsule Take 20 mg by mouth daily.  . pimecrolimus (ELIDEL) 1 % cream Apply topically 2 (two) times daily as needed.   No facility-administered encounter medications on file as of 02/04/2018.     Review of Systems  Constitutional: Negative for appetite change and unexpected weight change.  HENT: Negative for congestion and sinus pressure.   Respiratory: Negative for cough, chest tightness and shortness of breath.   Cardiovascular: Negative for chest pain, palpitations and leg swelling.  Gastrointestinal: Negative for abdominal pain, diarrhea, nausea and vomiting.  Genitourinary: Negative for difficulty urinating and dysuria.  Musculoskeletal: Negative for joint swelling and myalgias.  Skin: Negative for color change and rash.  Neurological: Negative for dizziness, light-headedness and headaches.  Psychiatric/Behavioral: Negative for  agitation and dysphoric mood.       Objective:    Physical Exam  Constitutional: She appears well-developed and well-nourished. No distress.  HENT:  Nose: Nose normal.  Mouth/Throat: Oropharynx is clear and moist.  Neck: Neck supple. No thyromegaly present.  Cardiovascular: Normal rate and regular rhythm.  Pulmonary/Chest: Breath sounds normal. No respiratory distress. She has no wheezes.  Abdominal: Soft. Bowel sounds are normal. There is no tenderness.  Ventral hernia - no pain to palpation.   Musculoskeletal: She exhibits no edema or tenderness.  Lymphadenopathy:    She has no cervical adenopathy.  Skin: No rash noted. No erythema.  Psychiatric: She has a normal mood and affect. Her behavior is normal.    BP 132/78 (BP Location: Left Arm, Patient Position: Sitting, Cuff Size: Large)   Pulse 71   Temp 98 F (36.7 C) (Oral)   Resp 18   Wt 297 lb 6.4 oz (134.9 kg)   SpO2 97%   BMI 48.00 kg/m  Wt Readings from Last 3 Encounters:  02/04/18 297 lb 6.4 oz (134.9 kg)  01/06/18 298 lb (135.2 kg)  12/06/17 (!) 307 lb (139.3 kg)     Lab Results  Component Value Date   WBC 7.5 09/16/2017   HGB 13.8 09/16/2017   HCT 40.6 09/16/2017   PLT 223 09/16/2017   GLUCOSE 138 (H) 01/27/2018   CHOL 212 (H) 01/27/2018   TRIG 200 (H) 01/27/2018   HDL 61 01/27/2018   LDLCALC 111 (H) 01/27/2018   ALT 17 01/27/2018   AST 15 01/27/2018   NA 140 01/27/2018   K 5.0 01/27/2018   CL 100 01/27/2018   CREATININE 0.62 01/27/2018   BUN 12 01/27/2018   CO2 25 01/27/2018   TSH 2.040 09/16/2017   HGBA1C 6.5 (H) 01/27/2018    US Abdomen Complete  Result Date: 01/13/2018 CLINICAL DATA:  Generalized abdominal pain EXAM: ABDOMEN ULTRASOUND COMPLETE COMPARISON:  None. FINDINGS: Gallbladder: No gallstones or wall thickening visualized. No sonographic Murphy sign noted by sonographer. Common bile duct: Diameter: 4 mm Liver: Echogenic liver with poor acoustic penetration. Portal vein is patent on  color Doppler imaging with normal direction of blood flow towards the liver. IVC: No abnormality visualized. Pancreas: Visualized portion is without convincing abnormality when allowing for artifact. No ductal dilatation. Spleen: Size and appearance within normal limits. Right Kidney: Length: 13 cm. Echogenicity within normal limits. No mass or hydronephrosis visualized. Left Kidney: Length: 13 cm. Echogenicity within normal limits. No mass or hydronephrosis visualized. Abdominal aorta: No aneurysm visualized. IMPRESSION: 1. Hepatic steatosis. 2. Negative gallbladder. Electronically Signed   By: Angelica Chessman  Watts M.D.   On: 01/13/2018 14:59       Assessment & Plan:   Problem List Items Addressed This Visit    Abdominal pain    No pain since the one episode in 11/2017.  Ventral hernia - no pain to palpation.  Planning for repair.        Diabetes mellitus (HCC)    Sugars much improved.  a1c just checked 6.5.  Has adjusted her diet.  Lost weight.  Feels better.  On metformin.  Will need to hold for surgery.        Relevant Orders   Hemoglobin A1c   Lipid panel   Hepatic function panel   Basic metabolic panel   Essential hypertension, benign    Blood pressure under good control.  Continue same medication regimen.  Follow pressures.  Follow metabolic panel.        Relevant Orders   Hepatic function panel   GERD (gastroesophageal reflux disease)    Controlled on prilosec.        Pre-op evaluation - Primary    Planning for ventral hernia repair.  Has adjusted her diet.  Lost weight.  a1c just checked 6.5.  Much improved.  On metformin.  Will need to hold metformin for procedure (per surgery).  No chest pain.  No sob.  Is exercising.  EKG - SR with no acute ischemic changes.  I feel from a cardiac standpoint, she is at low risk to proceed with the planned surgery.  Will need close intra op and post op monitoring of her heart rate and blood pressure to avoid extremes.        Relevant Orders    EKG 12-Lead (Completed)   Ventral hernia without obstruction or gangrene    Planning for repair.  Saw Dr Preston Fleeting.  Sugars better.  Recent a1c now 6.5.         Other Visit Diagnoses    Breast cancer screening       Relevant Orders   MM 3D SCREEN BREAST BILATERAL   Need for immunization against influenza       Relevant Orders   Flu Vaccine QUAD 36+ mos IM (Completed)       Einar Pheasant, MD

## 2018-02-07 ENCOUNTER — Encounter: Payer: Self-pay | Admitting: Internal Medicine

## 2018-02-07 DIAGNOSIS — Z01818 Encounter for other preprocedural examination: Secondary | ICD-10-CM | POA: Insufficient documentation

## 2018-02-07 NOTE — Assessment & Plan Note (Signed)
Planning for repair.  Saw Dr Preston Fleeting.  Sugars better.  Recent a1c now 6.5.

## 2018-02-07 NOTE — Assessment & Plan Note (Signed)
Planning for ventral hernia repair.  Has adjusted her diet.  Lost weight.  a1c just checked 6.5.  Much improved.  On metformin.  Will need to hold metformin for procedure (per surgery).  No chest pain.  No sob.  Is exercising.  EKG - SR with no acute ischemic changes.  I feel from a cardiac standpoint, she is at low risk to proceed with the planned surgery.  Will need close intra op and post op monitoring of her heart rate and blood pressure to avoid extremes.

## 2018-02-07 NOTE — Assessment & Plan Note (Signed)
Sugars much improved.  a1c just checked 6.5.  Has adjusted her diet.  Lost weight.  Feels better.  On metformin.  Will need to hold for surgery.

## 2018-02-07 NOTE — Assessment & Plan Note (Signed)
Controlled on prilosec.   

## 2018-02-07 NOTE — Assessment & Plan Note (Signed)
Blood pressure under good control.  Continue same medication regimen.  Follow pressures.  Follow metabolic panel.   

## 2018-02-07 NOTE — Assessment & Plan Note (Signed)
No pain since the one episode in 11/2017.  Ventral hernia - no pain to palpation.  Planning for repair.

## 2018-02-13 NOTE — Telephone Encounter (Signed)
-----   Message from Robert Bellow, MD sent at 02/13/2018  4:42 PM EDT ----- Regarding: RE: pre op clearance Thanks for your help. Gallbladder work up was negative, hepatic steatosis only.  Will arrange for OV to review plans and schedule hernia repair in October.  ----- Message ----- From: Einar Pheasant, MD Sent: 02/07/2018   5:51 AM EDT To: Robert Bellow, MD Subject: pre op clearance                               I saw this pt recently for f/u and pre op evaluation.  You had seen her and discussed ventral hernia repair.  Her sugars needed to be under better control.  She was started on metformin and made diet adjustments.  Has lost some weight.  Her recent a1c 6.5.  My note is in the chart.  Let me know if you need anything else.    Thanks   Mehr Depaoli.

## 2018-02-14 ENCOUNTER — Telehealth: Payer: Self-pay | Admitting: *Deleted

## 2018-02-14 NOTE — Telephone Encounter (Signed)
-----   Message from Robert Bellow, MD sent at 02/14/2018  1:14 PM EDT ----- Regarding: RE: pre op clearance Observation. Roselyn Reef if available would be nice, but not absolutely necessary. Two hours should be fine.  ----- Message ----- From: Dominga Ferry, CMA Sent: 02/14/2018  12:54 PM EDT To: Robert Bellow, MD Subject: RE: pre op clearance                           Will this be a surgery admit? Do you need an assistant? Will 2 hours be sufficient? Thanks.  ----- Message ----- From: Robert Bellow, MD Sent: 02/13/2018   4:42 PM EDT To: Dominga Ferry, CMA Subject: FW: pre op clearance                           Patient can schedule hernia surgery, but will need a preop office visit.  K81.9;  [id number]   ----- Message ----- From: Einar Pheasant, MD Sent: 02/07/2018   5:51 AM EDT To: Robert Bellow, MD Subject: pre op clearance                               I saw this pt recently for f/u and pre op evaluation.  You had seen her and discussed ventral hernia repair.  Her sugars needed to be under better control.  She was started on metformin and made diet adjustments.  Has lost some weight.  Her recent a1c 6.5.  My note is in the chart.  Let me know if you need anything else.    Thanks   Charlene.

## 2018-02-14 NOTE — Telephone Encounter (Signed)
Patient was contacted today and aware that we can go ahead and schedule surgery with Dr. Bary Castilla.   The patient wishes to think about when surgery will fit best in her schedule and speak with her husband as well.   Patient will contact the office when she wishes to proceed (either by phone or My Chart).   She is aware we would like to do surgery on a Monday as she would most likely be observation status.   The patient will require a pre-op visit with Dr. Bary Castilla.

## 2018-02-16 ENCOUNTER — Encounter: Payer: Self-pay | Admitting: General Surgery

## 2018-03-22 ENCOUNTER — Other Ambulatory Visit: Payer: Self-pay

## 2018-03-22 ENCOUNTER — Other Ambulatory Visit: Payer: Self-pay | Admitting: Internal Medicine

## 2018-03-22 ENCOUNTER — Encounter: Payer: Self-pay | Admitting: Internal Medicine

## 2018-03-22 MED ORDER — METFORMIN HCL 1000 MG PO TABS: 1000.0000 mg | ORAL_TABLET | Freq: Two times a day (BID) | ORAL | 2 refills | Status: DC

## 2018-03-22 MED ORDER — LOSARTAN POTASSIUM 50 MG PO TABS: 50.0000 mg | ORAL_TABLET | Freq: Every day | ORAL | 2 refills | Status: DC

## 2018-04-13 ENCOUNTER — Telehealth: Payer: Self-pay | Admitting: *Deleted

## 2018-04-13 NOTE — Telephone Encounter (Signed)
Patient notified of Pre-admission appointment date and time- 05-24-18 at 2:45 pm.

## 2018-04-13 NOTE — Telephone Encounter (Signed)
Patient's surgery is to be scheduled for 05-30-2018 at [facility] with Dr. Bary Castilla. Arvilla Meres, RN will be assisting with this case.   The patient will be contacted once Pre-Admission appointment has been scheduled to notify of date and time.   Patient has been scheduled for a pre-op visit with Dr. Bary Castilla for 05-17-18 at 9 am. Patient aware.

## 2018-04-26 ENCOUNTER — Ambulatory Visit
Admission: RE | Admit: 2018-04-26 | Discharge: 2018-04-26 | Disposition: A | Discharge status: Home / Self Care | Payer: 59 | Source: Ambulatory Visit | Attending: Internal Medicine | Admitting: Internal Medicine

## 2018-04-26 DIAGNOSIS — Z1231 Encounter for screening mammogram for malignant neoplasm of breast: Secondary | ICD-10-CM | POA: Diagnosis not present

## 2018-04-26 DIAGNOSIS — Z1239 Encounter for other screening for malignant neoplasm of breast: Secondary | ICD-10-CM | POA: Insufficient documentation

## 2018-05-06 ENCOUNTER — Telehealth: Payer: Self-pay

## 2018-05-06 NOTE — Telephone Encounter (Signed)
Call to patient to see about rescheduling her surgery with Dr Bary Castilla on 05/30/18.  The patient is now rescheduled for surgery at [facility] with Dr Bary Castilla on 06/20/18. She will come in for a pre op visit on 06/09/18 at 4:15 pm. She will be seen at the hospital for pre admit testing and we will call her with that new date and time.  Arvilla Meres, RN will be assisting with this case.

## 2018-05-09 ENCOUNTER — Telehealth: Payer: Self-pay | Admitting: *Deleted

## 2018-05-09 NOTE — Telephone Encounter (Signed)
Patient notified of new Pre-admission appointment date and time which is scheduled for 06-09-18 at 2:30 pm.  She verbalizes understanding.

## 2018-05-17 ENCOUNTER — Ambulatory Visit: Payer: 59 | Admitting: General Surgery

## 2018-05-23 ENCOUNTER — Encounter: Payer: Self-pay | Admitting: Internal Medicine

## 2018-05-23 ENCOUNTER — Ambulatory Visit (INDEPENDENT_AMBULATORY_CARE_PROVIDER_SITE_OTHER): Payer: 59 | Admitting: Internal Medicine

## 2018-05-23 VITALS — BP 128/76 | HR 77 | Temp 97.6°F | Resp 18 | Ht 66.0 in | Wt 288.6 lb

## 2018-05-23 DIAGNOSIS — E119 Type 2 diabetes mellitus without complications: Secondary | ICD-10-CM | POA: Diagnosis not present

## 2018-05-23 DIAGNOSIS — K219 Gastro-esophageal reflux disease without esophagitis: Secondary | ICD-10-CM

## 2018-05-23 DIAGNOSIS — E7800 Pure hypercholesterolemia, unspecified: Secondary | ICD-10-CM

## 2018-05-23 DIAGNOSIS — Z6841 Body Mass Index (BMI) 40.0 and over, adult: Secondary | ICD-10-CM

## 2018-05-23 DIAGNOSIS — K439 Ventral hernia without obstruction or gangrene: Secondary | ICD-10-CM

## 2018-05-23 DIAGNOSIS — I10 Essential (primary) hypertension: Secondary | ICD-10-CM | POA: Diagnosis not present

## 2018-05-23 DIAGNOSIS — Z0000 Encounter for general adult medical examination without abnormal findings: Secondary | ICD-10-CM | POA: Diagnosis not present

## 2018-05-23 LAB — HM DIABETES FOOT EXAM

## 2018-05-23 MED ORDER — METFORMIN HCL 1000 MG PO TABS: 1000.0000 mg | ORAL_TABLET | Freq: Two times a day (BID) | ORAL | 1 refills | Status: DC

## 2018-05-23 MED ORDER — LOSARTAN POTASSIUM 50 MG PO TABS: 50.0000 mg | ORAL_TABLET | Freq: Every day | ORAL | 1 refills | Status: DC

## 2018-05-23 NOTE — Assessment & Plan Note (Signed)
Physical today 05/23/18.  PAP 02/05/16 - negative with negative HPV.  Mammogram 04/27/18 - Birads I.  Colonoscopy 08/05/12 - recommended f/u colonoscopy in 10 years.

## 2018-05-23 NOTE — Progress Notes (Addendum)
Patient ID: Wendy Callahan, female   DOB: 1960/10/16, 58 y.o.   MRN: [id number]   Subjective:    Patient ID: Wendy Callahan, female    DOB: 01-08-1961, 58 y.o.   MRN: [id number]  HPI  Patient here for her physical exam.  She reports she is doing relatively well.  Still trying to watch her diet.  Discussed diet and exercise.  Has kept her weight down.  States am sugars averaging 120-130.  States pm sugars relatively same.  No chest pain.  No sob.  No acid reflux.  No abdominal pain.  Bowels moving.  Planning for surgery 06/20/18.  Seeing Dr Bary Castilla.     Past Medical History:  Diagnosis Date  . Allergy   . Arthritis   . Environmental allergies   . GERD (gastroesophageal reflux disease)   . Hypercholesterolemia   . Hypertension   . Murmur    Past Surgical History:  Procedure Laterality Date  . BLADDER SURGERY  1973  . COLONOSCOPY  2014   Dr Vira Agar  . TUBAL LIGATION  10/1991  . UPPER GI ENDOSCOPY  2015   Dr Vira Agar   Family History  Adopted: Yes  Problem Relation Age of Onset  . Congenital heart disease Son        Tetrology of fallot   Social History   Socioeconomic History  . Marital status: Married    Spouse name: Not on file  . Number of children: 2  . Years of education: Not on file  . Highest education level: Not on file  Occupational History  . Occupation: Scientist, research (physical sciences): [employer]    Comment: [place]  . Financial resource strain: Not on file  . Food insecurity:    Worry: Not on file    Inability: Not on file  . Transportation needs:    Medical: Not on file    Non-medical: Not on file  Tobacco Use  . Smoking status: Never Smoker  . Smokeless tobacco: Never Used  Substance and Sexual Activity  . Alcohol use: Yes    Alcohol/week: 0.0 standard drinks    Comment: occasional  . Drug use: No  . Sexual activity: Not on file  Lifestyle  . Physical activity:    Days per week: Not on file    Minutes per session:  Not on file  . Stress: Not on file  Relationships  . Social connections:    Talks on phone: Not on file    Gets together: Not on file    Attends religious service: Not on file    Active member of club or organization: Not on file    Attends meetings of clubs or organizations: Not on file    Relationship status: Not on file  Other Topics Concern  . Not on file  Social History Narrative  . Not on file    Outpatient Encounter Medications as of 05/23/2018  Medication Sig  . Acetaminophen (TYLENOL ARTHRITIS PAIN PO) Take 1,300 mg by mouth 2 (two) times daily.   Marland Kitchen acyclovir ointment (ZOVIRAX) 5 % Apply topical as directed (Patient taking differently: Apply 1 application topically daily as needed (fever blisters). Apply topical as directed)  . losartan (COZAAR) 50 MG tablet Take 1 tablet (50 mg total) by mouth daily. (Patient taking differently: Take 50 mg by mouth at bedtime. )  . metFORMIN (GLUCOPHAGE) 1000 MG tablet Take 1 tablet (1,000 mg total) by mouth 2 (two) times daily  with a meal.  . omeprazole (PRILOSEC) 20 MG capsule Take 20 mg by mouth every morning.   . pimecrolimus (ELIDEL) 1 % cream Apply topically 2 (two) times daily as needed. (Patient taking differently: Apply 1 application topically 2 (two) times daily as needed (eczema). )  . [DISCONTINUED] fexofenadine (ALLEGRA) 180 MG tablet Take 180 mg by mouth daily.  . [DISCONTINUED] losartan (COZAAR) 50 MG tablet Take 1 tablet (50 mg total) by mouth daily.  . [DISCONTINUED] metFORMIN (GLUCOPHAGE) 1000 MG tablet Take 1 tablet (1,000 mg total) by mouth 2 (two) times daily with a meal.   No facility-administered encounter medications on file as of 05/23/2018.     Review of Systems  Constitutional: Negative for appetite change and unexpected weight change.  HENT: Negative for congestion and sinus pressure.   Eyes: Negative for pain and visual disturbance.  Respiratory: Negative for cough, chest tightness and shortness of breath.     Cardiovascular: Negative for chest pain, palpitations and leg swelling.  Gastrointestinal: Negative for abdominal pain, diarrhea, nausea and vomiting.  Genitourinary: Negative for difficulty urinating and dysuria.  Musculoskeletal: Negative for joint swelling and myalgias.  Skin: Negative for color change and rash.  Neurological: Negative for dizziness, light-headedness and headaches.  Hematological: Negative for adenopathy. Does not bruise/bleed easily.  Psychiatric/Behavioral: Negative for agitation and dysphoric mood.       Objective:    Physical Exam Constitutional:      General: She is not in acute distress.    Appearance: Normal appearance. She is well-developed.  HENT:     Nose: Nose normal.  Eyes:     General: No scleral icterus.       Right eye: No discharge.        Left eye: No discharge.  Neck:     Musculoskeletal: Neck supple.     Thyroid: No thyromegaly.  Cardiovascular:     Rate and Rhythm: Normal rate and regular rhythm.  Pulmonary:     Effort: No tachypnea, accessory muscle usage or respiratory distress.     Breath sounds: Normal breath sounds. No decreased breath sounds or wheezing.  Chest:     Breasts:        Right: No inverted nipple, mass, nipple discharge or tenderness (no axillary adenopathy).        Left: No inverted nipple, mass, nipple discharge or tenderness (no axilarry adenopathy).  Abdominal:     General: Bowel sounds are normal.     Palpations: Abdomen is soft.     Tenderness: There is no abdominal tenderness.  Musculoskeletal:        General: No swelling or tenderness.     Comments: Feet:  No lesions.  DP pulses palpable and equal bilaterally.    Lymphadenopathy:     Cervical: No cervical adenopathy.  Skin:    Findings: No erythema or rash.  Neurological:     Mental Status: She is alert and oriented to person, place, and time.  Psychiatric:        Mood and Affect: Mood normal.        Behavior: Behavior normal.     BP 128/76 (BP  Location: Left Arm, Patient Position: Sitting, Cuff Size: Large)   Pulse 77   Temp 97.6 F (36.4 C) (Oral)   Resp 18   Ht '5\' 6"'  (1.676 m)   Wt 288 lb 9.6 oz (130.9 kg)   SpO2 97%   BMI 46.58 kg/m  Wt Readings from Last 3 Encounters:  05/23/18 288  lb 9.6 oz (130.9 kg)  02/04/18 297 lb 6.4 oz (134.9 kg)  01/06/18 298 lb (135.2 kg)     Lab Results  Component Value Date   WBC 7.5 09/16/2017   HGB 13.8 09/16/2017   HCT 40.6 09/16/2017   PLT 223 09/16/2017   GLUCOSE 138 (H) 01/27/2018   CHOL 212 (H) 01/27/2018   TRIG 200 (H) 01/27/2018   HDL 61 01/27/2018   LDLCALC 111 (H) 01/27/2018   ALT 17 01/27/2018   AST 15 01/27/2018   NA 140 01/27/2018   K 5.0 01/27/2018   CL 100 01/27/2018   CREATININE 0.62 01/27/2018   BUN 12 01/27/2018   CO2 25 01/27/2018   TSH 2.040 09/16/2017   HGBA1C 6.5 (H) 01/27/2018    Mm 3d Screen Breast Bilateral  Result Date: 04/27/2018 CLINICAL DATA:  Screening. EXAM: DIGITAL SCREENING BILATERAL MAMMOGRAM WITH TOMO AND CAD COMPARISON:  Previous exam(s). ACR Breast Density Category b: There are scattered areas of fibroglandular density. FINDINGS: There are no findings suspicious for malignancy. Images were processed with CAD. IMPRESSION: No mammographic evidence of malignancy. A result letter of this screening mammogram will be mailed directly to the patient. RECOMMENDATION: Screening mammogram in one year. (Code:SM-B-01Y) BI-RADS CATEGORY  1: Negative. Electronically Signed   By: Lillia Mountain M.D.   On: 04/27/2018 08:11       Assessment & Plan:   Problem List Items Addressed This Visit    BMI 45.0-49.9, adult ([place])    Discussed diet and exercise.  Follow.        Relevant Medications   metFORMIN (GLUCOPHAGE) 1000 MG tablet   Diabetes mellitus (HCC)    Low carb diet and exercise.  Follow met b and a1c.  She has adjusted her diet and lost weight.        Relevant Medications   metFORMIN (GLUCOPHAGE) 1000 MG tablet   losartan (COZAAR) 50 MG tablet    Other Relevant Orders   Hemoglobin E7N   Basic metabolic panel   Essential hypertension, benign    Blood pressure under good control.  Continue same medication regimen.  Follow pressures.  Follow metabolic panel.        Relevant Medications   losartan (COZAAR) 50 MG tablet   GERD (gastroesophageal reflux disease)    Controlled on current regimen.  Follow.        Health care maintenance    Physical today 05/23/18.  PAP 02/05/16 - negative with negative HPV.  Mammogram 04/27/18 - Birads I.  Colonoscopy 08/05/12 - recommended f/u colonoscopy in 10 years.        Hypercholesterolemia    Low cholesterol diet and exercise.  Follow lipid panel.        Relevant Medications   losartan (COZAAR) 50 MG tablet   Other Relevant Orders   Hepatic function panel   Lipid panel   Ventral hernia without obstruction or gangrene    Planning for surgery.  See previous note.        Other Visit Diagnoses    Routine general medical examination at a health care facility    -  Primary       Einar Pheasant, MD

## 2018-05-24 ENCOUNTER — Other Ambulatory Visit: Payer: 59

## 2018-05-28 ENCOUNTER — Encounter: Payer: Self-pay | Admitting: Internal Medicine

## 2018-05-28 NOTE — Assessment & Plan Note (Signed)
Blood pressure under good control.  Continue same medication regimen.  Follow pressures.  Follow metabolic panel.   

## 2018-05-28 NOTE — Assessment & Plan Note (Signed)
Low carb diet and exercise.  Follow met b and a1c.  She has adjusted her diet and lost weight.

## 2018-05-28 NOTE — Assessment & Plan Note (Signed)
Discussed diet and exercise.  Follow.  

## 2018-05-28 NOTE — Assessment & Plan Note (Signed)
Planning for surgery.  See previous note.

## 2018-05-28 NOTE — Assessment & Plan Note (Signed)
Low cholesterol diet and exercise.  Follow lipid panel.   

## 2018-05-28 NOTE — Assessment & Plan Note (Signed)
Controlled on current regimen.  Follow.  

## 2018-05-31 DIAGNOSIS — E7800 Pure hypercholesterolemia, unspecified: Secondary | ICD-10-CM | POA: Diagnosis not present

## 2018-05-31 DIAGNOSIS — E119 Type 2 diabetes mellitus without complications: Secondary | ICD-10-CM | POA: Diagnosis not present

## 2018-06-01 ENCOUNTER — Encounter: Payer: Self-pay | Admitting: Internal Medicine

## 2018-06-01 ENCOUNTER — Telehealth: Payer: Self-pay | Admitting: *Deleted

## 2018-06-01 LAB — BASIC METABOLIC PANEL
BUN/Creatinine Ratio: 19 (ref 9–23)
BUN: 13 mg/dL (ref 6–24)
CO2: 23 mmol/L (ref 20–29)
Calcium: 9.9 mg/dL (ref 8.7–10.2)
Chloride: 103 mmol/L (ref 96–106)
Creatinine, Ser: 0.68 mg/dL (ref 0.57–1.00)
GFR calc Af Amer: 113 mL/min/{1.73_m2} (ref >59)
GFR calc non Af Amer: 98 mL/min/{1.73_m2} (ref >59)
Glucose: 137 mg/dL — ABNORMAL HIGH (ref 65–99)
Potassium: 4.8 mmol/L (ref 3.5–5.2)
Sodium: 141 mmol/L (ref 134–144)

## 2018-06-01 LAB — HEPATIC FUNCTION PANEL
ALT: 14 IU/L (ref 0–32)
AST: 13 IU/L (ref 0–40)
Albumin: 4.6 g/dL (ref 3.5–5.5)
Alkaline Phosphatase: 101 IU/L (ref 39–117)
Bilirubin Total: 0.2 mg/dL (ref 0.0–1.2)
Bilirubin, Direct: 0.09 mg/dL (ref 0.00–0.40)
Total Protein: 6.9 g/dL (ref 6.0–8.5)

## 2018-06-01 LAB — LIPID PANEL
Chol/HDL Ratio: 3.5 ratio (ref 0.0–4.4)
Cholesterol, Total: 223 mg/dL — ABNORMAL HIGH (ref 100–199)
HDL: 63 mg/dL (ref >39)
LDL Calculated: 119 mg/dL — ABNORMAL HIGH (ref 0–99)
Triglycerides: 207 mg/dL — ABNORMAL HIGH (ref 0–149)
VLDL Cholesterol Cal: 41 mg/dL — ABNORMAL HIGH (ref 5–40)

## 2018-06-01 LAB — HEMOGLOBIN A1C
Est. average glucose Bld gHb Est-mCnc: 134 mg/dL
Hgb A1c MFr Bld: 6.3 % — ABNORMAL HIGH (ref 4.8–5.6)

## 2018-06-01 NOTE — Telephone Encounter (Signed)
Patient contacted today and notified that Dr. Bary Castilla is on leave.   She was given the option to see another Psychologist, sport and exercise.   Patient does not want to see another surgeon.   Patient will be contacted once we have been given Dr. Dwyane Luo return to work date.

## 2018-06-06 ENCOUNTER — Other Ambulatory Visit: Payer: 59

## 2018-06-09 ENCOUNTER — Ambulatory Visit: Payer: 59 | Admitting: General Surgery

## 2018-06-09 ENCOUNTER — Inpatient Hospital Stay: Admission: RE | Admit: 2018-06-09 | Payer: 59 | Source: Ambulatory Visit

## 2018-06-13 ENCOUNTER — Telehealth: Payer: Self-pay | Admitting: *Deleted

## 2018-06-13 NOTE — Telephone Encounter (Signed)
Patient called the office back and wants to move surgery from 07-11-18 to 07-18-18.  This has been done per patient request.   Message left for patient to call Caryl-Lyn tomorrow for details.   We need to inform her of pre-admit appointment scheduled for 07-05-18 at 2:30 pm. Also, need to schedule patient for pre-op with Dr. Bary Castilla. Patient has requested this be the same day as pre-admit appointment. Office visit needs to be prior to pre-admit appointment so Dr. Bary Castilla can complete orders and h&p.

## 2018-06-13 NOTE — Telephone Encounter (Signed)
Patient was contacted today and notified that Dr. Bary Castilla is back.  The patient was offered to keep original surgery date of 06-20-18 as originally scheduled but patient declines due to plans she has already made.   The patient wishes to reschedule surgery for 07-11-18.   Patient will need a pre-op visit with Dr. Bary Castilla and we will try and get Pre-admit appointment scheduled the same day late in the afternoon.   Patient will be contacted once [facility] has posted. She verbalizes understanding.

## 2018-06-14 NOTE — Telephone Encounter (Signed)
Patient will come in for a pre op appointment with Dr Bary Castilla on 07/05/18 at 1:30 pm, she will go to pre admit testing afterwards at 2:30 pm. She is aware of dates, times, and instructions.

## 2018-06-28 ENCOUNTER — Telehealth: Payer: Self-pay

## 2018-06-28 NOTE — Telephone Encounter (Signed)
Copied from [place] [id number]. Topic: General - Other >> Jun 28, 2018 12:55 PM Yvette Rack wrote: Reason for CRM: Manuela Schwartz with [facility] stated the losartan (COZAAR) 50 MG tablet is on backorder. Manuela Schwartz requests Rx for losartan (COZAAR) 25 MG tablet

## 2018-06-28 NOTE — Telephone Encounter (Signed)
Ok given to Manuela Schwartz to change to 25 but needs to let pt know so she takes correct dose

## 2018-06-29 ENCOUNTER — Telehealth: Payer: Self-pay | Admitting: *Deleted

## 2018-06-29 ENCOUNTER — Encounter: Payer: Self-pay | Admitting: *Deleted

## 2018-06-29 NOTE — Telephone Encounter (Signed)
Message left on cell phone for patient to call the office.   We were just notified that as of now, [facility] will be out of network for all [facility] coverage effective 07-17-18.   Patient is currently scheduled for surgery on 07-18-18 at [facility] with Dr. Bary Castilla.

## 2018-06-29 NOTE — Telephone Encounter (Signed)
Patient called the office back and was notified per previous message.   The patient is requesting a cost difference of out of network vs in network charges.   I told the patient I am unable to provide this information to her but I could get her the number so she can call [facility] directly.   Patient verbalizes understanding and wishes that this info be sent to her via My Chart.

## 2018-07-05 ENCOUNTER — Encounter: Payer: Self-pay | Admitting: General Surgery

## 2018-07-05 ENCOUNTER — Other Ambulatory Visit: Payer: Self-pay

## 2018-07-05 ENCOUNTER — Ambulatory Visit: Payer: 59 | Admitting: General Surgery

## 2018-07-05 ENCOUNTER — Encounter
Admission: RE | Admit: 2018-07-05 | Discharge: 2018-07-05 | Disposition: A | Discharge status: Home / Self Care | Payer: 59 | Source: Ambulatory Visit | Attending: General Surgery | Admitting: General Surgery

## 2018-07-05 VITALS — BP 138/70 | HR 74 | Temp 97.9°F | Resp 15 | Ht 66.0 in | Wt 286.0 lb

## 2018-07-05 DIAGNOSIS — Z01812 Encounter for preprocedural laboratory examination: Secondary | ICD-10-CM | POA: Insufficient documentation

## 2018-07-05 DIAGNOSIS — K439 Ventral hernia without obstruction or gangrene: Secondary | ICD-10-CM | POA: Diagnosis not present

## 2018-07-05 DIAGNOSIS — Z7689 Persons encountering health services in other specified circumstances: Secondary | ICD-10-CM | POA: Diagnosis not present

## 2018-07-05 HISTORY — DX: Unspecified convulsions: R56.9

## 2018-07-05 HISTORY — DX: Prediabetes: R73.03

## 2018-07-05 NOTE — Patient Instructions (Signed)
Your procedure is scheduled on: Monday, March 2, 20 Report to Day Surgery on the 2nd floor of the [facility]. To find out your arrival time, please call [phone] between 1PM - 3PM on: Friday, Feb. 28  REMEMBER: Instructions that are not followed completely may result in serious medical risk, up to and including death; or upon the discretion of your surgeon and anesthesiologist your surgery may need to be rescheduled.  Do not eat food after midnight the night before surgery.  No gum chewing, lozengers or hard candies.  You may however, drink CLEAR liquids up to 2 hours before you are scheduled to arrive for your surgery. Do not drink anything within 2 hours of the start of your surgery.  Clear liquids include: - water  - apple juice without pulp - gatorade - black coffee or tea (Do NOT add milk or creamers to the coffee or tea) Do NOT drink anything that is not on this list.  No Alcohol for 24 hours before or after surgery.  No Smoking including e-cigarettes for 24 hours prior to surgery.  No chewable tobacco products for at least 6 hours prior to surgery.  No nicotine patches on the day of surgery.  On the morning of surgery brush your teeth with toothpaste and water, you may rinse your mouth with mouthwash if you wish. Do not swallow any toothpaste or mouthwash.  Notify your doctor if there is any change in your medical condition (cold, fever, infection).  Do not wear jewelry, make-up, hairpins, clips or nail polish.  Do not wear lotions, powders, or perfumes.   Do not shave 48 hours prior to surgery.   Contacts and dentures may not be worn into surgery.  Do not bring valuables to the hospital, including drivers license, insurance or credit cards.  [facility] is not responsible for any belongings or valuables.   TAKE THESE MEDICATIONS THE MORNING OF SURGERY:  1.  Tylenol (if needed for pain) 2.  Omeprazole - (take one the night before and one on the morning of  surgery - helps to prevent nausea after surgery.)  Use CHG Soap as directed on instruction sheet.  Stop Metformin 2 days prior to surgery. Last day to take is Friday, Feb. 28; resume after surgery.  Starting Feb. 24 - Stop Anti-inflammatories (NSAIDS) such as Advil, Aleve, Ibuprofen, Motrin, Naproxen, Naprosyn and Aspirin based products such as Excedrin, Goodys Powder, BC Powder. (May take Tylenol or Acetaminophen if needed.)  Starting Feb. 24 - Stop ANY OVER THE COUNTER supplements until after surgery.  Wear comfortable clothing (specific to your surgery type) to the hospital.  Plan for stool softeners for home use.  If you are being discharged the day of surgery, you will not be allowed to drive home. You will need a responsible adult to drive you home and stay with you that night.   If you are taking public transportation, you will need to have a responsible adult with you. Please confirm with your physician that it is acceptable to use public transportation.   Please call [phone] if you have any questions about these instructions.

## 2018-07-05 NOTE — Progress Notes (Signed)
Patient ID: Wendy Callahan, female   DOB: 1960-08-16, 58 y.o.   MRN: [id number]  Chief Complaint  Patient presents with  . Pre-op Exam    ventral hernia     HPI Wendy Callahan is a 59 y.o. female here today for her pre op ventral hernia repair scheduled on 07/18/2018.  The patient has had some weight loss since her last exam.  Much better glucose control.  Using low glycemic carbs. HPI  Past Medical History:  Diagnosis Date  . Allergy   . Arthritis   . Environmental allergies   . GERD (gastroesophageal reflux disease)   . Hypercholesterolemia   . Hypertension   . Murmur   . Pre-diabetes   . Seizure ([place])    AS A CHILD    Past Surgical History:  Procedure Laterality Date  . BLADDER SURGERY  1973  . COLONOSCOPY  2014   Dr Vira Agar  . TUBAL LIGATION  10/1991  . UPPER GI ENDOSCOPY  2015   Dr Vira Agar    Family History  Adopted: Yes  Problem Relation Age of Onset  . Congenital heart disease Son        Tetrology of fallot    Social History Social History   Tobacco Use  . Smoking status: Former Smoker    Types: Cigarettes  . Smokeless tobacco: Never Used  Substance Use Topics  . Alcohol use: Yes    Alcohol/week: 0.0 standard drinks    Comment: occasional  . Drug use: No    Allergies  Allergen Reactions  . Latex     Chaps skin  . Tetracyclines & Related Other (See Comments)    Yeast infection    Current Outpatient Medications  Medication Sig Dispense Refill  . Acetaminophen (TYLENOL ARTHRITIS PAIN PO) Take 1,300 mg by mouth 2 (two) times daily.     Marland Kitchen acyclovir ointment (ZOVIRAX) 5 % Apply topical as directed (Patient taking differently: Apply 1 application topically daily as needed (fever blisters). Apply topical as directed) 15 g 0  . fexofenadine (ALLEGRA) 180 MG tablet Take 180 mg by mouth daily.    Marland Kitchen losartan (COZAAR) 50 MG tablet Take 1 tablet (50 mg total) by mouth daily. (Patient taking differently: Take 50 mg by mouth at bedtime. ) 90 tablet 1  .  metFORMIN (GLUCOPHAGE) 1000 MG tablet Take 1 tablet (1,000 mg total) by mouth 2 (two) times daily with a meal. 180 tablet 1  . omeprazole (PRILOSEC) 20 MG capsule Take 20 mg by mouth every morning.     . pimecrolimus (ELIDEL) 1 % cream Apply topically 2 (two) times daily as needed. (Patient taking differently: Apply 1 application topically 2 (two) times daily as needed (eczema). ) 60 g 0   No current facility-administered medications for this visit.     Review of Systems Review of Systems  Constitutional: Negative.   Respiratory: Negative.   Cardiovascular: Negative.     Blood pressure 138/70, pulse 74, temperature 97.9 F (36.6 C), temperature source Skin, resp. rate 15, height 5\' 6"  (1.676 m), weight 286 lb (129.7 kg), SpO2 98 %. The patient's weight is down 12 pounds from her August 2019 exam.  Physical Exam Physical Exam Constitutional:      Appearance: She is well-developed.  HENT:     Mouth/Throat:     Pharynx: No oropharyngeal exudate.  Eyes:     General: No scleral icterus.    Conjunctiva/sclera: Conjunctivae normal.  Neck:     Musculoskeletal: Neck supple.  Cardiovascular:     Rate and Rhythm: Normal rate and regular rhythm.     Pulses: Normal pulses.     Heart sounds: Normal heart sounds.  Pulmonary:     Effort: Pulmonary effort is normal.     Breath sounds: Normal breath sounds.  Chest:     Breasts:        Right: No inverted nipple, mass, nipple discharge, skin change or tenderness.        Left: No inverted nipple, mass, nipple discharge, skin change or tenderness.  Abdominal:     Palpations: Abdomen is soft.     Hernia: A hernia is present. Hernia is present in the ventral area.       Comments: Ventral hernia smaller  Lymphadenopathy:     Cervical: No cervical adenopathy.  Skin:    General: Skin is warm and dry.  Neurological:     Mental Status: She is alert and oriented to person, place, and time.  Psychiatric:        Mood and Affect: Mood normal.      Data Reviewed Laboratory studies of May 31, 2018:  Hemoglobin A1c: 6.3. Basic metabolic panel showed a creatinine of 0.68, estimated GFR of 98 normal electrolytes.  Assessment    Improved glucose control,  Ventral hernia with entrapped omentum secondary to previous laparoscopic procedure.    Plan  Indications for elective repair of the hernia were reviewed.  Role of prosthetic mesh placement discussed.  Risk of infection discussed. Patient is scheduled for ventral hernia repair on 07/18/2018.   10 pound weight restriction for lifting post procedure.  No driving until pain-free.  Hernia precautions and incarceration were discussed with the patient. If they develop symptoms of an incarcerated hernia, they were encouraged to seek prompt medical attention.  I have recommended repair of the hernia using mesh on an outpatient basis in the near future. The risk of infection was reviewed. The role of prosthetic mesh to minimize the risk of recurrence was reviewed.  The patient is aware to call back for any questions or concerns.   HPI, Physical Exam, Assessment and Plan have been scribed under the direction and in the presence of Hervey Ard, MD.  Gaspar Cola, CMA HPI, assessment, plan and physical exam has been scribed under the direction and in the presence of Robert Bellow, MD. Karie Fetch, RN   I have completed the exam and reviewed the above documentation for accuracy and completeness.  I agree with the above.  Haematologist has been used and any errors in dictation or transcription are unintentional.  Hervey Ard, M.D., F.A.C.S.  Wendy Callahan 07/06/2018, 12:09 PM

## 2018-07-05 NOTE — Patient Instructions (Addendum)
The patient is aware to call back for any questions or new concerns. Surgery 07-18-18  Hernia, Adult     A hernia is the bulging of an organ or tissue through a weak spot in the muscles of the abdomen (abdominal wall). Hernias develop most often near the belly button (navel) or the area where the leg meets the lower abdomen (groin). Common types of hernias include:  Incisional hernia. This type bulges through a scar from an abdominal surgery.  Umbilical hernia. This type develops near the navel.  Inguinal hernia. This type develops in the groin or scrotum.  Femoral hernia. This type develops under the groin, in the upper thigh area.  Hiatal hernia. This type occurs when part of the stomach slides above the muscle that separates the abdomen from the chest (diaphragm). What are the causes? This condition may be caused by:  Heavy lifting.  Coughing over a long period of time.  Straining to have a bowel movement. Constipation can lead to straining.  An incision made during an abdominal surgery.  A physical problem that is present at birth (congenital defect).  Being overweight or obese.  Smoking.  Excess fluid in the abdomen.  Undescended testicles in males. What are the signs or symptoms? The main symptom is a skin-colored, rounded bulge in the area of the hernia. However, a bulge may not always be present. It may grow bigger or be more visible when you cough or strain (such as when lifting something heavy). A hernia that can be pushed back into the area (is reducible) rarely causes pain. A hernia that cannot be pushed back into the area (is incarcerated) may lose its blood supply (become strangulated). A hernia that is incarcerated may cause:  Pain.  Fever.  Nausea and vomiting.  Swelling.  Constipation. How is this diagnosed? A hernia may be diagnosed based on:  Your symptoms and medical history.  A physical exam. Your health care provider may ask you to cough or  move in certain ways to see if the hernia becomes visible.  Imaging tests, such as: ? X-rays. ? Ultrasound. ? CT scan. How is this treated? A hernia that is small and painless may not need to be treated. A hernia that is large or painful may be treated with surgery. Inguinal hernias may be treated with surgery to prevent incarceration or strangulation. Strangulated hernias are always treated with surgery because a lack of blood supply to the trapped organ or tissue can cause it to die. Surgery to treat a hernia involves pushing the bulge back into place and repairing the weak area of the muscle or abdominal wall. Follow these instructions at home: Activity  Avoid straining.  Do not lift anything that is heavier than 10 lb (4.5 kg), or the limit that you are told, until your health care provider says that it is safe.  When lifting heavy objects, lift with your leg muscles, not your back muscles. Preventing constipation  Take actions to prevent constipation. Constipation leads to straining with bowel movements, which can make a hernia worse or cause a hernia repair to break down. Your health care provider may recommend that you: ? Drink enough fluid to keep your urine pale yellow. ? Eat foods that are high in fiber, such as fresh fruits and vegetables, whole grains, and beans. ? Limit foods that are high in fat and processed sugars, such as fried or sweet foods. ? Take an over-the-counter or prescription medicine for constipation. General instructions  When  coughing, try to cough gently.  You may try to push the hernia back in place by very gently pressing on it while lying down. Do not try to force the bulge back in if it will not push in easily.  If you are overweight, work with your health care provider to lose weight safely.  Do not use any products that contain nicotine or tobacco, such as cigarettes and e-cigarettes. If you need help quitting, ask your health care provider.  If  you are scheduled for hernia repair, watch your hernia for any changes in shape, size, or color. Tell your health care provider about any changes or new symptoms.  Take over-the-counter and prescription medicines only as told by your health care provider.  Keep all follow-up visits as told by your health care provider. This is important. Contact a health care provider if:  You develop new pain, swelling, or redness around your hernia.  You have signs of constipation, such as: ? Fewer bowel movements in a week than normal. ? Difficulty having a bowel movement. ? Stools that are dry, hard, or larger than normal. Get help right away if:  You have a fever.  You have abdomen pain that gets worse.  You feel nauseous or you vomit.  You cannot push the hernia back in place by very gently pressing on it while lying down. Do not try to force the bulge back in if it will not push in easily.  The hernia: ? Changes in shape, size, or color. ? Feels hard or tender. These symptoms may represent a serious problem that is an emergency. Do not wait to see if the symptoms will go away. Get medical help right away. Call your local emergency services (911 in the [place] Summary  A hernia is the bulging of an organ or tissue through a weak spot in the muscles of the abdomen (abdominal wall).  The main symptom is a skin-colored, rounded lump (bulge) in the hernia area. However, a bulge may not always be present. It may grow bigger or more visible when you cough or strain (such as when having a bowel movement).  A hernia that is small and painless may not need to be treated. A hernia that is large or painful may be treated with surgery.  Surgery to treat a hernia involves pushing the bulge back into place and repairing the weak part of the abdomen. This information is not intended to replace advice given to you by your health care provider. Make sure you discuss any questions you have with your health  care provider. Document Released: 05/04/2005 Document Revised: 02/03/2017 Document Reviewed: 02/03/2017 Elsevier Interactive Patient Education  2019 [employer].

## 2018-07-06 ENCOUNTER — Other Ambulatory Visit: Payer: Self-pay | Admitting: General Surgery

## 2018-07-06 DIAGNOSIS — K439 Ventral hernia without obstruction or gangrene: Secondary | ICD-10-CM

## 2018-07-17 ENCOUNTER — Encounter: Payer: Self-pay | Admitting: Anesthesiology

## 2018-07-18 ENCOUNTER — Encounter
Admission: RE | Disposition: A | Discharge status: Home / Self Care | Payer: Self-pay | Source: Home / Self Care | Attending: General Surgery

## 2018-07-18 ENCOUNTER — Ambulatory Visit
Admission: RE | Admit: 2018-07-18 | Discharge: 2018-07-18 | Disposition: A | Discharge status: Home / Self Care | Payer: 59 | Attending: General Surgery | Admitting: General Surgery

## 2018-07-18 ENCOUNTER — Ambulatory Visit: Payer: 59 | Admitting: Anesthesiology

## 2018-07-18 ENCOUNTER — Encounter: Payer: Self-pay | Admitting: *Deleted

## 2018-07-18 DIAGNOSIS — Z7984 Long term (current) use of oral hypoglycemic drugs: Secondary | ICD-10-CM | POA: Insufficient documentation

## 2018-07-18 DIAGNOSIS — K439 Ventral hernia without obstruction or gangrene: Secondary | ICD-10-CM | POA: Diagnosis not present

## 2018-07-18 DIAGNOSIS — I10 Essential (primary) hypertension: Secondary | ICD-10-CM | POA: Insufficient documentation

## 2018-07-18 DIAGNOSIS — Z6841 Body Mass Index (BMI) 40.0 and over, adult: Secondary | ICD-10-CM | POA: Insufficient documentation

## 2018-07-18 DIAGNOSIS — Z79899 Other long term (current) drug therapy: Secondary | ICD-10-CM | POA: Diagnosis not present

## 2018-07-18 DIAGNOSIS — Z87891 Personal history of nicotine dependence: Secondary | ICD-10-CM | POA: Diagnosis not present

## 2018-07-18 DIAGNOSIS — K219 Gastro-esophageal reflux disease without esophagitis: Secondary | ICD-10-CM | POA: Insufficient documentation

## 2018-07-18 DIAGNOSIS — K432 Incisional hernia without obstruction or gangrene: Secondary | ICD-10-CM | POA: Diagnosis not present

## 2018-07-18 HISTORY — PX: VENTRAL HERNIA REPAIR: SHX424

## 2018-07-18 LAB — GLUCOSE, CAPILLARY
Glucose-Capillary: 150 mg/dL — ABNORMAL HIGH (ref 70–99)
Glucose-Capillary: 156 mg/dL — ABNORMAL HIGH (ref 70–99)

## 2018-07-18 SURGERY — REPAIR, HERNIA, VENTRAL
Anesthesia: General

## 2018-07-18 MED ORDER — HYDROMORPHONE HCL 1 MG/ML IJ SOLN
INTRAMUSCULAR | Status: AC
  Filled 2018-07-18: qty 1

## 2018-07-18 MED ORDER — CEFAZOLIN SODIUM-DEXTROSE 2-4 GM/100ML-% IV SOLN
2.0000 g | INTRAVENOUS | Status: AC
  Administered 2018-07-18: 2 g via INTRAVENOUS

## 2018-07-18 MED ORDER — BUPIVACAINE-EPINEPHRINE (PF) 0.5% -1:200000 IJ SOLN
INTRAMUSCULAR | Status: DC | PRN
  Administered 2018-07-18: 30 mL

## 2018-07-18 MED ORDER — SODIUM CHLORIDE 0.9 % IV SOLN
INTRAVENOUS | Status: DC
  Administered 2018-07-18: 09:00:00 via INTRAVENOUS

## 2018-07-18 MED ORDER — FENTANYL CITRATE (PF) 100 MCG/2ML IJ SOLN
25.0000 ug | INTRAMUSCULAR | Status: AC | PRN
  Administered 2018-07-18 (×6): 25 ug via INTRAVENOUS

## 2018-07-18 MED ORDER — FENTANYL CITRATE (PF) 100 MCG/2ML IJ SOLN
INTRAMUSCULAR | Status: AC
  Administered 2018-07-18: 25 ug via INTRAVENOUS
  Filled 2018-07-18: qty 2

## 2018-07-18 MED ORDER — HYDROMORPHONE HCL 1 MG/ML IJ SOLN
0.2500 mg | INTRAMUSCULAR | Status: DC | PRN
  Administered 2018-07-18 (×4): 0.25 mg via INTRAVENOUS

## 2018-07-18 MED ORDER — EPINEPHRINE PF 1 MG/ML IJ SOLN
INTRAMUSCULAR | Status: AC
  Filled 2018-07-18: qty 1

## 2018-07-18 MED ORDER — BUPIVACAINE HCL (PF) 0.5 % IJ SOLN
INTRAMUSCULAR | Status: AC
  Filled 2018-07-18: qty 30

## 2018-07-18 MED ORDER — HYDROCODONE-ACETAMINOPHEN 5-325 MG PO TABS
1.0000 | ORAL_TABLET | Freq: Once | ORAL | Status: AC
  Administered 2018-07-18: 1 via ORAL

## 2018-07-18 MED ORDER — ROCURONIUM BROMIDE 100 MG/10ML IV SOLN
INTRAVENOUS | Status: DC | PRN
  Administered 2018-07-18: 30 mg via INTRAVENOUS
  Administered 2018-07-18 (×3): 20 mg via INTRAVENOUS

## 2018-07-18 MED ORDER — KETOROLAC TROMETHAMINE 30 MG/ML IJ SOLN
INTRAMUSCULAR | Status: DC | PRN
  Administered 2018-07-18: 30 mg via INTRAVENOUS

## 2018-07-18 MED ORDER — FENTANYL CITRATE (PF) 250 MCG/5ML IJ SOLN
INTRAMUSCULAR | Status: AC
  Filled 2018-07-18: qty 5

## 2018-07-18 MED ORDER — LIDOCAINE HCL (CARDIAC) PF 100 MG/5ML IV SOSY
PREFILLED_SYRINGE | INTRAVENOUS | Status: DC | PRN
  Administered 2018-07-18: 100 mg via INTRAVENOUS

## 2018-07-18 MED ORDER — MIDAZOLAM HCL 2 MG/2ML IJ SOLN
INTRAMUSCULAR | Status: AC
  Filled 2018-07-18: qty 2

## 2018-07-18 MED ORDER — DEXAMETHASONE SODIUM PHOSPHATE 10 MG/ML IJ SOLN
INTRAMUSCULAR | Status: DC | PRN
  Administered 2018-07-18: 10 mg via INTRAVENOUS

## 2018-07-18 MED ORDER — ONDANSETRON HCL 4 MG/2ML IJ SOLN: 4.0000 mg | Freq: Once | INTRAMUSCULAR | Status: DC | PRN

## 2018-07-18 MED ORDER — SUCCINYLCHOLINE CHLORIDE 20 MG/ML IJ SOLN
INTRAMUSCULAR | Status: DC | PRN
  Administered 2018-07-18: 100 mg via INTRAVENOUS

## 2018-07-18 MED ORDER — ACETAMINOPHEN 10 MG/ML IV SOLN
INTRAVENOUS | Status: DC | PRN
  Administered 2018-07-18: 1000 mg via INTRAVENOUS

## 2018-07-18 MED ORDER — HYDROCODONE-ACETAMINOPHEN 5-325 MG PO TABS
ORAL_TABLET | ORAL | Status: AC
  Administered 2018-07-18: 1 via ORAL
  Filled 2018-07-18: qty 1

## 2018-07-18 MED ORDER — HYDROCODONE-ACETAMINOPHEN 5-325 MG PO TABS: 1.0000 | ORAL_TABLET | ORAL | 0 refills | Status: DC | PRN

## 2018-07-18 MED ORDER — CEFAZOLIN SODIUM-DEXTROSE 2-4 GM/100ML-% IV SOLN
INTRAVENOUS | Status: AC
  Filled 2018-07-18: qty 100

## 2018-07-18 MED ORDER — GABAPENTIN 300 MG PO CAPS
ORAL_CAPSULE | ORAL | Status: AC
  Filled 2018-07-18: qty 1

## 2018-07-18 MED ORDER — HYDROMORPHONE HCL 1 MG/ML IJ SOLN
INTRAMUSCULAR | Status: AC
  Administered 2018-07-18: 0.25 mg via INTRAVENOUS
  Filled 2018-07-18: qty 1

## 2018-07-18 MED ORDER — GABAPENTIN 300 MG PO CAPS
300.0000 mg | ORAL_CAPSULE | ORAL | Status: AC
  Administered 2018-07-18: 300 mg via ORAL

## 2018-07-18 MED ORDER — PROPOFOL 10 MG/ML IV BOLUS
INTRAVENOUS | Status: DC | PRN
  Administered 2018-07-18: 100 mg via INTRAVENOUS

## 2018-07-18 MED ORDER — PHENYLEPHRINE HCL 10 MG/ML IJ SOLN
INTRAMUSCULAR | Status: DC | PRN
  Administered 2018-07-18: 100 ug via INTRAVENOUS

## 2018-07-18 MED ORDER — FENTANYL CITRATE (PF) 100 MCG/2ML IJ SOLN
INTRAMUSCULAR | Status: DC | PRN
  Administered 2018-07-18 (×2): 50 ug via INTRAVENOUS

## 2018-07-18 MED ORDER — SUGAMMADEX SODIUM 500 MG/5ML IV SOLN
INTRAVENOUS | Status: DC | PRN
  Administered 2018-07-18: 200 mg via INTRAVENOUS

## 2018-07-18 MED ORDER — LACTATED RINGERS IV SOLN
INTRAVENOUS | Status: DC | PRN
  Administered 2018-07-18: 10:00:00 via INTRAVENOUS

## 2018-07-18 MED ORDER — MIDAZOLAM HCL 2 MG/2ML IJ SOLN
INTRAMUSCULAR | Status: DC | PRN
  Administered 2018-07-18: 2 mg via INTRAVENOUS

## 2018-07-18 MED ORDER — ONDANSETRON HCL 4 MG/2ML IJ SOLN
INTRAMUSCULAR | Status: DC | PRN
  Administered 2018-07-18: 4 mg via INTRAVENOUS

## 2018-07-18 SURGICAL SUPPLY — 41 items
BINDER ABDOMINAL 12 ML 46-62 (SOFTGOODS) ×2 IMPLANT
BLADE SURG 15 STRL SS SAFETY (BLADE) ×2 IMPLANT
BULB RESERV EVAC DRAIN JP 100C (MISCELLANEOUS) ×2 IMPLANT
CANISTER SUCT 1200ML W/VALVE (MISCELLANEOUS) ×2 IMPLANT
CHLORAPREP W/TINT 26ML (MISCELLANEOUS) ×2 IMPLANT
COVER WAND RF STERILE (DRAPES) IMPLANT
DRAIN CHANNEL JP 15F RND 16 (MISCELLANEOUS) ×2 IMPLANT
DRAPE CHEST BREAST 77X106 FENE (MISCELLANEOUS) ×2 IMPLANT
DRAPE LAPAROTOMY 100X77 ABD (DRAPES) IMPLANT
DRSG TEGADERM 4X4.75 (GAUZE/BANDAGES/DRESSINGS) IMPLANT
DRSG TELFA 3X8 NADH (GAUZE/BANDAGES/DRESSINGS) ×2 IMPLANT
ELECT REM PT RETURN 9FT ADLT (ELECTROSURGICAL) ×2
ELECTRODE REM PT RTRN 9FT ADLT (ELECTROSURGICAL) ×1 IMPLANT
GAUZE SPONGE 4X4 12PLY STRL (GAUZE/BANDAGES/DRESSINGS) IMPLANT
GOWN STRL REUS W/ TWL LRG LVL3 (GOWN DISPOSABLE) ×3 IMPLANT
GOWN STRL REUS W/TWL LRG LVL3 (GOWN DISPOSABLE) ×3
GRASPER SUT TROCAR 14GX15 (MISCELLANEOUS) ×2 IMPLANT
KIT TURNOVER KIT A (KITS) ×2 IMPLANT
LABEL OR SOLS (LABEL) ×2 IMPLANT
MESH BARD SOFT 6X6IN (Mesh General) ×2 IMPLANT
NEEDLE HYPO 22GX1.5 SAFETY (NEEDLE) ×2 IMPLANT
NEEDLE HYPO 25X1 1.5 SAFETY (NEEDLE) IMPLANT
NS IRRIG 500ML POUR BTL (IV SOLUTION) ×2 IMPLANT
PACK BASIN MINOR ARMC (MISCELLANEOUS) ×2 IMPLANT
SPONGE LAP 18X18 RF (DISPOSABLE) ×2 IMPLANT
STAPLER SKIN PROX 35W (STAPLE) IMPLANT
STRIP CLOSURE SKIN 1/2X4 (GAUZE/BANDAGES/DRESSINGS) ×2 IMPLANT
SUT ETHILON 3-0 FS-10 30 BLK (SUTURE) ×2
SUT MAXON 0 T 12 3 (SUTURE) ×10 IMPLANT
SUT SURGILON 0 BLK (SUTURE) IMPLANT
SUT VIC AB 2-0 BRD 54 (SUTURE) ×4 IMPLANT
SUT VIC AB 2-0 CT1 27 (SUTURE) ×7
SUT VIC AB 2-0 CT1 TAPERPNT 27 (SUTURE) ×7 IMPLANT
SUT VIC AB 3-0 SH 27 (SUTURE)
SUT VIC AB 3-0 SH 27X BRD (SUTURE) IMPLANT
SUT VIC AB 4-0 FS2 27 (SUTURE) ×2 IMPLANT
SUT VICRYL+ 3-0 144IN (SUTURE) ×2 IMPLANT
SUTURE EHLN 3-0 FS-10 30 BLK (SUTURE) ×1 IMPLANT
SYR 10ML LL (SYRINGE) ×2 IMPLANT
SYR 3ML LL SCALE MARK (SYRINGE) ×2 IMPLANT
TRAY FOLEY MTR SLVR 16FR STAT (SET/KITS/TRAYS/PACK) IMPLANT

## 2018-07-18 NOTE — Op Note (Signed)
Preoperative diagnosis: Ventral hernia.  Postoperative diagnosis: Same.  Operative procedure: Repair of ventral hernia with component separation, Bard soft mesh reinforcement.  Operating Surgeon: Hervey Ard, MD.  Assistant Arvilla Meres, RNFA.  Anesthesia: General endotracheal, Marcaine 0.5% with 1-200,000's of epinephrine, 30 cc.  Estimated blood loss: Less than 50 cc.  Clinical note: This 58 year old woman is developed a incisional hernia at the site of a previous laparoscopic port.  In the past she has had evidence of incarcerated omentum.  On her redosed recent visit this was nearly completely reducible.  She is admitted for elective repair.  She received Ancef prior to the procedure.  Operative note: With the patient under adequate general endotracheal anesthesia the abdomen was cleansed with ChloraPrep and draped.  Marcaine was infiltrated approximately 6 cm away from the midline to help with postoperative analgesia.  A midline incision was swept to the left around the umbilicus and extended slightly inferiorly.  The skin was incised sharply remaining dissection completed with electrocautery.  The dominant hernia sac notified and 2 small additional fascial defects were identified.  The sac was excised and discarded.  The eventual defect size was approximately 4 x 3 cm.  Due to her generous abdominal girth it was elected to place a retrorectus mesh.  The posterior rectus sheath was entered on both sides and the rectus swept superiorly.  This allowed for a space easily of 5-6 cm in all directions.  The posterior rectus sheath was closed with interrupted 2-0 Vicryl sutures.  A 10 x 12 cm piece of Bard soft mesh was placed in the retrorectus space.  It was anchored in the midline with trans-fascial 0 Maxon sutures.  Trans-abdominal wall sutures of 0 Maxon were placed x3 on each side is making use of the PMI suture passer.  The mesh based bar was smoothed in its position.  A 15 [place] Blake  drain was placed into the retrorectus space to minimize the risk of seroma formation.  This was brought out through a separate stab wound incision days prior to the left of the midline and anchored into position with 3-0 nylon suture.  The midline fascia was closed based bar with interrupted 0 Maxon sutures taking a small "bite" of the mesh to provide good adherence with age passage of the needle.  The adipose layer was approximated in layers with 2-0 Vicryl suture.  The skin was closed with a running 4-0 Vicryl subcuticular suture.  Trans-fascial suture sites were closed with Dermabond.  A honeycomb dressing was placed followed by ABD pads and an abdominal binder.  The patient awakened nicely with no coughing on extubation.  She was transported to the recovery room in stable condition.

## 2018-07-18 NOTE — Anesthesia Post-op Follow-up Note (Signed)
Anesthesia QCDR form completed.        

## 2018-07-18 NOTE — Discharge Instructions (Signed)
Laparoscopic Ventral Hernia Repair, Care After °This sheet gives you information about how to care for yourself after your procedure. Your health care provider may also give you more specific instructions. If you have problems or questions, contact your health care provider. °What can I expect after the procedure? °After the procedure, it is common to have: °· Pain, discomfort, or soreness. °Follow these instructions at home: °Incision care ° °· Follow instructions from your health care provider about how to take care of your incision. Make sure you: °? Wash your hands with soap and water before you change your bandage (dressing) or before you touch your abdomen. If soap and water are not available, use hand sanitizer. °? Change your dressing as told by your health care provider. °? Leave stitches (sutures), skin glue, or adhesive strips in place. These skin closures may need to stay in place for 2 weeks or longer. If adhesive strip edges start to loosen and curl up, you may trim the loose edges. Do not remove adhesive strips completely unless your health care provider tells you to do that. °· Check your incision area every day for signs of infection. Check for: °? Redness, swelling, or pain. °? Fluid or blood. °? Warmth. °? Pus or a bad smell. °Bathing ° °· Do not take baths, swim, or use a hot tub until your health care provider approves. Ask your health care provider if you can take showers. You may only be allowed to take sponge baths for bathing. °· Keep your bandage (dressing) dry until your health care provider says it can be removed. °Activity °· Do not lift anything that is heavier than 10 lb (4.5 kg) until your health care provider approves. °· Do not drive or use heavy machinery while taking prescription pain medicine. Ask your health care provider when it is safe for you to drive or use heavy machinery. °· Do not drive for 24 hours if you were given a medicine to help you relax (sedative) during your  procedure. °· Rest as told by your health care provider. You may return to your normal activities when your health care provider approves. °General instructions °· Take over-the-counter and prescription medicines only as told by your health care provider. °· To prevent or treat constipation while you are taking prescription pain medicine, your health care provider may recommend that you: °? Take over-the-counter or prescription medicines. °? Eat foods that are high in fiber, such as fresh fruits and vegetables, whole grains, and beans. °? Limit foods that are high in fat and processed sugars, such as fried and sweet foods. °· Drink enough fluid to keep your urine clear or pale yellow. °· Hold a pillow over your abdomen when you cough or sneeze. This helps with pain. °· Keep all follow-up visits as told by your health care provider. This is important. °Contact a health care provider if: °· You have: °? A fever or chills. °? Redness, swelling, or pain around your incision. °? Fluid or blood coming from your incision. °? Pus or a bad smell coming from your incision. °? Pain that gets worse or does not get better with medicine. °? Nausea or vomiting. °? A cough. °? Shortness of breath. °· Your incision feels warm to the touch. °· You have not had a bowel movement in three days. °· You are not able to urinate. °Get help right away if: °· You have severe pain in your abdomen. °· You have persistent nausea and vomiting. °· You have   redness, warmth, or pain in your leg.  You have chest pain.  You have trouble breathing. Summary  After this procedure, it is common to have pain, discomfort, or soreness.  Follow instructions from your health care provider about how to take care of your incision.  Check your incision area every day for signs of infection. Report any signs of infection to your health care provider.  Keep all follow-up visits as told by your health care provider. This is important. This information  is not intended to replace advice given to you by your health care provider. Make sure you discuss any questions you have with your health care provider. Document Released: 04/20/2012 Document Revised: 12/25/2015 Document Reviewed: 12/25/2015 Elsevier Interactive Patient Education  2019 [place]   1) The drugs that you were given will stay in your system until tomorrow so for the next 24 hours you should not:  A) Drive an automobile B) Make any legal decisions C) Drink any alcoholic beverage   2) You may resume regular meals tomorrow.  Today it is better to start with liquids and gradually work up to solid foods.  You may eat anything you prefer, but it is better to start with liquids, then soup and crackers, and gradually work up to solid foods.   3) Please notify your doctor immediately if you have any unusual bleeding, trouble breathing, redness and pain at the surgery site, drainage, fever, or pain not relieved by medication.    4) Additional Instructions:        Please contact your physician with any problems or Same Day Surgery at [phone], Monday through Friday 6 am to 4 pm, or [place] at [facility] number at [phone].

## 2018-07-18 NOTE — H&P (Signed)
No change in clinical condition or exam.  For ventral hernia repair.

## 2018-07-18 NOTE — Anesthesia Procedure Notes (Signed)
Procedure Name: Intubation Date/Time: 07/18/2018 9:44 AM Performed by: Justus Memory, CRNA Pre-anesthesia Checklist: Patient identified, Patient being monitored, Timeout performed, Emergency Drugs available and Suction available Patient Re-evaluated:Patient Re-evaluated prior to induction Oxygen Delivery Method: Circle system utilized Preoxygenation: Pre-oxygenation with 100% oxygen Induction Type: IV induction Ventilation: Mask ventilation without difficulty Laryngoscope Size: Mac and 3 Grade View: Grade I Tube type: Oral Tube size: 7.0 mm Number of attempts: 1 Airway Equipment and Method: Stylet Placement Confirmation: ETT inserted through vocal cords under direct vision,  positive ETCO2 and breath sounds checked- equal and bilateral Secured at: 20 cm Tube secured with: Tape Dental Injury: Teeth and Oropharynx as per pre-operative assessment

## 2018-07-18 NOTE — Anesthesia Postprocedure Evaluation (Signed)
Anesthesia Post Note  Patient: Wendy Callahan  Procedure(s) Performed: HERNIA REPAIR VENTRAL ADULT WITH COMPONENT SEPERATION (N/A )  Patient location during evaluation: PACU Anesthesia Type: General Level of consciousness: awake and alert Pain management: pain level controlled Vital Signs Assessment: post-procedure vital signs reviewed and stable Respiratory status: spontaneous breathing, nonlabored ventilation and respiratory function stable Cardiovascular status: blood pressure returned to baseline and stable Postop Assessment: no apparent nausea or vomiting Anesthetic complications: no     Last Vitals:  Vitals:   07/18/18 1334 07/18/18 1353  BP: 120/68 135/78  Pulse: 66 66  Resp: 12 18  Temp: 37.4 C 36.8 C  SpO2: 91% 95%    Last Pain:  Vitals:   07/18/18 1353  TempSrc: Oral  PainSc: 4                  Durenda Hurt

## 2018-07-18 NOTE — Transfer of Care (Addendum)
Immediate Anesthesia Transfer of Care Note  Patient: Wendy Callahan  Procedure(s) Performed: HERNIA REPAIR VENTRAL ADULT WITH COMPONENT SEPERATION (N/A )  Patient Location: PACU  Anesthesia Type:General  Level of Consciousness: sedated  Airway & Oxygen Therapy: Patient Spontanous Breathing and Patient connected to face mask oxygen  Post-op Assessment: Report given to RN and Post -op Vital signs reviewed and stable  Post vital signs: Reviewed and stable  Last Vitals:  Vitals Value Taken Time  BP 122/78 07/18/2018 12:00 PM  Temp 36.5 C 07/18/2018 12:00 PM  Pulse 65 07/18/2018 12:03 PM  Resp 15 07/18/2018 12:03 PM  SpO2 99 % 07/18/2018 12:03 PM  Vitals shown include unvalidated device data.  Last Pain:  Vitals:   07/18/18 0829  TempSrc: Tympanic  PainSc: 0-No pain         Complications: No apparent anesthesia complications

## 2018-07-18 NOTE — Anesthesia Preprocedure Evaluation (Addendum)
Anesthesia Evaluation  Patient identified by MRN, date of birth, ID band Patient awake    Reviewed: Allergy & Precautions, NPO status , Patient's Chart, lab work & pertinent test results, reviewed documented beta blocker date and time   Airway Mallampati: III  TM Distance: >3 FB     Dental  (+) Chipped   Pulmonary former smoker,           Cardiovascular hypertension,      Neuro/Psych Seizures -, Well Controlled,     GI/Hepatic GERD  Controlled,  Endo/Other  diabetes, Type 2Morbid obesity  Renal/GU      Musculoskeletal  (+) Arthritis ,   Abdominal   Peds  Hematology   Anesthesia Other Findings EKG ok.  Reproductive/Obstetrics                             Anesthesia Physical Anesthesia Plan  ASA: III  Anesthesia Plan: General   Post-op Pain Management:    Induction: Intravenous  PONV Risk Score and Plan:   Airway Management Planned: Oral ETT  Additional Equipment:   Intra-op Plan:   Post-operative Plan:   Informed Consent: I have reviewed the patients History and Physical, chart, labs and discussed the procedure including the risks, benefits and alternatives for the proposed anesthesia with the patient or authorized representative who has indicated his/her understanding and acceptance.       Plan Discussed with: CRNA  Anesthesia Plan Comments:        Anesthesia Quick Evaluation

## 2018-07-19 ENCOUNTER — Encounter: Payer: Self-pay | Admitting: General Surgery

## 2018-07-19 NOTE — Progress Notes (Addendum)
Pt taking great documentation on her JP drain and it is sounds like it is doing great. Pt stated the binder was pinching her bra line and I told her she could adjust a little so it does not feel like it is scratching/bruising her rib cage.

## 2018-07-21 ENCOUNTER — Other Ambulatory Visit: Payer: Self-pay

## 2018-07-21 ENCOUNTER — Encounter: Payer: Self-pay | Admitting: General Surgery

## 2018-07-21 ENCOUNTER — Ambulatory Visit (INDEPENDENT_AMBULATORY_CARE_PROVIDER_SITE_OTHER): Payer: 59 | Admitting: General Surgery

## 2018-07-21 VITALS — BP 162/90 | HR 72 | Temp 97.9°F | Resp 18 | Ht 66.0 in | Wt 286.8 lb

## 2018-07-21 DIAGNOSIS — K439 Ventral hernia without obstruction or gangrene: Secondary | ICD-10-CM

## 2018-07-21 NOTE — Progress Notes (Signed)
Patient ID: Wendy Callahan, female   DOB: 1961/03/21, 58 y.o.   MRN: [id number]  Chief Complaint  Patient presents with  . Routine Post Op    Ventral Hernia repair    HPI Wendy Callahan is a 58 y.o. female.  Here today for post operative care for Ventral hernia repair. Patient states she is still very sore.  HPI  Past Medical History:  Diagnosis Date  . Allergy   . Arthritis   . Environmental allergies   . GERD (gastroesophageal reflux disease)   . Hypercholesterolemia   . Hypertension   . Murmur   . Pre-diabetes   . Seizure ([place])    AS A CHILD    Past Surgical History:  Procedure Laterality Date  . BLADDER SURGERY  1973  . COLONOSCOPY  2014   Dr Vira Agar  . TUBAL LIGATION  10/1991  . UPPER GI ENDOSCOPY  2015   Dr Vira Agar  . VENTRAL HERNIA REPAIR N/A 07/18/2018   Procedure: HERNIA REPAIR VENTRAL ADULT WITH COMPONENT SEPERATION;  Surgeon: Robert Bellow, MD;  Location: [facility] ORS;  Service: General;  Laterality: N/A;    Family History  Adopted: Yes  Problem Relation Age of Onset  . Congenital heart disease Son        Tetrology of fallot    Social History Social History   Tobacco Use  . Smoking status: Former Smoker    Types: Cigarettes  . Smokeless tobacco: Never Used  Substance Use Topics  . Alcohol use: Yes    Alcohol/week: 0.0 standard drinks    Comment: occasional  . Drug use: No    Allergies  Allergen Reactions  . Latex     Chaps skin  . Tetracyclines & Related Other (See Comments)    Yeast infection    Current Outpatient Medications  Medication Sig Dispense Refill  . Acetaminophen (TYLENOL ARTHRITIS PAIN PO) Take 1,300 mg by mouth 2 (two) times daily.     Marland Kitchen acyclovir ointment (ZOVIRAX) 5 % Apply topical as directed (Patient taking differently: Apply 1 application topically daily as needed (fever blisters). Apply topical as directed) 15 g 0  . fexofenadine (ALLEGRA) 180 MG tablet Take 180 mg by mouth daily.    Marland Kitchen HYDROcodone-acetaminophen  (NORCO/VICODIN) 5-325 MG tablet Take 1 tablet by mouth every 4 (four) hours as needed for moderate pain. 20 tablet 0  . losartan (COZAAR) 50 MG tablet Take 1 tablet (50 mg total) by mouth daily. (Patient taking differently: Take 50 mg by mouth at bedtime. ) 90 tablet 1  . metFORMIN (GLUCOPHAGE) 1000 MG tablet Take 1 tablet (1,000 mg total) by mouth 2 (two) times daily with a meal. 180 tablet 1  . omeprazole (PRILOSEC) 20 MG capsule Take 20 mg by mouth every morning.     . pimecrolimus (ELIDEL) 1 % cream Apply topically 2 (two) times daily as needed. (Patient taking differently: Apply 1 application topically 2 (two) times daily as needed (eczema). ) 60 g 0   No current facility-administered medications for this visit.     Review of Systems Review of Systems  Constitutional: Negative.   Respiratory: Negative.   Cardiovascular: Negative.     Blood pressure (!) 162/90, pulse 72, temperature 97.9 F (36.6 C), temperature source Temporal, resp. rate 18, height 5\' 6"  (1.676 m), weight 286 lb 12.8 oz (130.1 kg), last menstrual period 05/18/2016, SpO2 97 %.  Physical Exam Physical Exam Constitutional:      Appearance: Normal appearance.  HENT:  Mouth/Throat:     Pharynx: No oropharyngeal exudate.  Eyes:     General: No scleral icterus. Neck:     Musculoskeletal: Neck supple.  Cardiovascular:     Rate and Rhythm: Normal rate and regular rhythm.     Heart sounds: Normal heart sounds.  Pulmonary:     Effort: Pulmonary effort is normal.     Breath sounds: Normal breath sounds.  Abdominal:    Skin:    General: Skin is warm and dry.  Neurological:     Mental Status: She is alert and oriented to person, place, and time.  Psychiatric:        Mood and Affect: Mood normal.     Data Reviewed Drain record shows volumes about 65 cc/day.  Assessment Doing well post repair of multilobulated hernia above the level of the umbilicus with retrorectus repair.  Plan  The patient is aware  to use a heating pad as needed for comfort.  The patient will discontinue the abdominal binder which has been her greatest nemesis and surgery.  Continue drain record sheet  Follow up in 5 days    HPI, assessment, plan and physical exam has been scribed under the direction and in the presence of Robert Bellow, MD. Karie Fetch, RN   HPI, Physical Exam, Assessment and Plan have been scribed under the direction and in the presence of Robert Bellow, MD. Jonnie Finner, CMA  I have completed the exam and reviewed the above documentation for accuracy and completeness.  I agree with the above.  Haematologist has been used and any errors in dictation or transcription are unintentional.  Hervey Ard, M.D., F.A.C.S.  Forest Gleason [place] 07/22/2018, 2:42 PM

## 2018-07-21 NOTE — Patient Instructions (Addendum)
The patient is aware to call back for any questions or new concerns. The patient is aware to use a heating pad as needed for comfort.  Continue drain record sheet Follow up in 5 days Surgical Drain Record Empty your surgical drain as told by your health care provider. Use this form to write down the amount of fluid that has collected in the drainage container. Bring this form with you to your follow-up visits. Surgical drain #1 location: ___________________  Date __________ Time __________ Amount __________ Date __________ Time __________ Amount __________ Date __________ Time __________ Amount __________ Date __________ Time __________ Amount __________ Date __________ Time __________ Amount __________ Date __________ Time __________ Amount __________ Date __________ Time __________ Amount __________ Date __________ Time __________ Amount __________ Date __________ Time __________ Amount __________ Date __________ Time __________ Amount __________ Date __________ Time __________ Amount __________ Date __________ Time __________ Amount __________ Date __________ Time __________ Amount __________ Date __________ Time __________ Amount __________ Date __________ Time __________ Amount __________ Date __________ Time __________ Amount __________ Date __________ Time __________ Amount __________ Date __________ Time __________ Amount __________ Date __________ Time __________ Amount __________ Date __________ Time __________ Amount __________ Date __________ Time __________ Amount __________  Date __________ Time __________ Amount __________ Date __________ Time __________ Amount __________ Date __________ Time __________ Amount __________ Date __________ Time __________ Amount __________ Date __________ Time __________ Amount __________ Date __________ Time __________ Amount __________ Date __________ Time __________ Amount __________ Date __________ Time __________ Amount  __________ Date __________ Time __________ Amount __________ Date __________ Time __________ Amount __________ Date __________ Time __________ Amount __________ Date __________ Time __________ Amount __________ Date __________ Time __________ Amount __________ Date __________ Time __________ Amount __________ Date __________ Time __________ Amount __________ Date __________ Time __________ Amount __________ Date __________ Time __________ Amount __________ Date __________ Time __________ Amount __________ Date __________ Time __________ Amount __________ Date __________ Time __________ Amount __________ Date __________ Time __________ Amount __________ This information is not intended to replace advice given to you by your health care provider. Make sure you discuss any questions you have with your health care provider. Document Released: 02/08/2017 Document Revised: 02/08/2017 Document Reviewed: 02/08/2017 Elsevier Interactive Patient Education  2019 [employer].

## 2018-07-22 ENCOUNTER — Encounter: Payer: Self-pay | Admitting: General Surgery

## 2018-07-26 ENCOUNTER — Encounter: Payer: Self-pay | Admitting: General Surgery

## 2018-07-26 ENCOUNTER — Ambulatory Visit (INDEPENDENT_AMBULATORY_CARE_PROVIDER_SITE_OTHER): Payer: 59 | Admitting: General Surgery

## 2018-07-26 VITALS — BP 132/74 | HR 72 | Temp 97.8°F | Ht 68.0 in | Wt 286.0 lb

## 2018-07-26 DIAGNOSIS — K439 Ventral hernia without obstruction or gangrene: Secondary | ICD-10-CM

## 2018-07-26 NOTE — Patient Instructions (Signed)
Return in two weeks. The patient is aware to call back for any questions or concerns.  

## 2018-07-26 NOTE — Progress Notes (Signed)
Patient ID: Wendy Callahan, female   DOB: [birth year], 58 y.o.   MRN: [id number]  Chief Complaint  Patient presents with  . Routine Post Op    HPI Wendy Callahan is a 58 y.o. female here today for her post op ventral hernia repair done on 07/18/2018. Drain sheet present.  HPI  Past Medical History:  Diagnosis Date  . Allergy   . Arthritis   . Environmental allergies   . GERD (gastroesophageal reflux disease)   . Hypercholesterolemia   . Hypertension   . Murmur   . Pre-diabetes   . Seizure ([place])    AS A CHILD    Past Surgical History:  Procedure Laterality Date  . BLADDER SURGERY  1973  . COLONOSCOPY  2014   Dr Vira Agar  . TUBAL LIGATION  10/1991  . UPPER GI ENDOSCOPY  2015   Dr Vira Agar  . VENTRAL HERNIA REPAIR N/A 07/18/2018   10 x 12 cm Bard soft mesh retrorectus space HERNIA REPAIR VENTRAL ADULT WITH COMPONENT SEPERATION;  Surgeon: Robert Bellow, MD;  Location: [facility] ORS;  Service: General;  Laterality: N/A;    Family History  Adopted: Yes  Problem Relation Age of Onset  . Congenital heart disease Son        Tetrology of fallot    Social History Social History   Tobacco Use  . Smoking status: Former Smoker    Types: Cigarettes  . Smokeless tobacco: Never Used  Substance Use Topics  . Alcohol use: Yes    Alcohol/week: 0.0 standard drinks    Comment: occasional  . Drug use: No    Allergies  Allergen Reactions  . Latex     Chaps skin  . Tetracyclines & Related Other (See Comments)    Yeast infection    Current Outpatient Medications  Medication Sig Dispense Refill  . Acetaminophen (TYLENOL ARTHRITIS PAIN PO) Take 1,300 mg by mouth 2 (two) times daily.     Marland Kitchen acyclovir ointment (ZOVIRAX) 5 % Apply topical as directed (Patient taking differently: Apply 1 application topically daily as needed (fever blisters). Apply topical as directed) 15 g 0  . fexofenadine (ALLEGRA) 180 MG tablet Take 180 mg by mouth daily.    Marland Kitchen HYDROcodone-acetaminophen  (NORCO/VICODIN) 5-325 MG tablet Take 1 tablet by mouth every 4 (four) hours as needed for moderate pain. 20 tablet 0  . losartan (COZAAR) 50 MG tablet Take 1 tablet (50 mg total) by mouth daily. (Patient taking differently: Take 50 mg by mouth at bedtime. ) 90 tablet 1  . metFORMIN (GLUCOPHAGE) 1000 MG tablet Take 1 tablet (1,000 mg total) by mouth 2 (two) times daily with a meal. 180 tablet 1  . omeprazole (PRILOSEC) 20 MG capsule Take 20 mg by mouth every morning.     . pimecrolimus (ELIDEL) 1 % cream Apply topically 2 (two) times daily as needed. (Patient taking differently: Apply 1 application topically 2 (two) times daily as needed (eczema). ) 60 g 0   No current facility-administered medications for this visit.     Review of Systems Review of Systems  Constitutional: Negative.   Respiratory: Negative.   Cardiovascular: Negative.     Blood pressure 132/74, pulse 72, temperature 97.8 F (36.6 C), temperature source Skin, height 5\' 8"  (1.727 m), weight 286 lb (129.7 kg), last menstrual period 05/18/2016, SpO2 98 %.  Physical Exam Physical Exam Abdominal:       Data Reviewed Drainage volume has fallen to less than 20 cc/day.  Assessment  Doing well post ventral hernia repair with retrorectus mesh.  Plan Return in two weeks.The patient is aware to call back for any questions or concerns.   Will extend her work from home note until August 08, 2018.    HPI, Physical Exam, Assessment and Plan have been scribed under the direction and in the presence of Hervey Ard, MD.  Gaspar Cola, CMA   I have completed the exam and reviewed the above documentation for accuracy and completeness.  I agree with the above.  Haematologist has been used and any errors in dictation or transcription are unintentional.  Hervey Ard, M.D., F.A.C.S.   Forest Gleason Helma Argyle 07/27/2018, 11:48 AM

## 2018-07-27 ENCOUNTER — Encounter: Payer: Self-pay | Admitting: General Surgery

## 2018-07-27 NOTE — Progress Notes (Unsigned)
Patient has been called, Wendy Callahan for her to pick up extended work note per Dr.Byrnett.

## 2018-07-27 NOTE — Progress Notes (Signed)
Corrected work note placed in patients chart. Dates have been changed per Dr.Byrnetts approval.

## 2018-08-09 ENCOUNTER — Ambulatory Visit (INDEPENDENT_AMBULATORY_CARE_PROVIDER_SITE_OTHER): Payer: 59 | Admitting: General Surgery

## 2018-08-09 ENCOUNTER — Other Ambulatory Visit: Payer: Self-pay

## 2018-08-09 ENCOUNTER — Encounter: Payer: Self-pay | Admitting: General Surgery

## 2018-08-09 VITALS — BP 132/79 | HR 78 | Temp 97.9°F | Resp 14 | Ht 66.0 in | Wt 286.0 lb

## 2018-08-09 DIAGNOSIS — K439 Ventral hernia without obstruction or gangrene: Secondary | ICD-10-CM

## 2018-08-09 NOTE — Progress Notes (Signed)
Patient ID: Wendy Callahan, female   DOB: 1961-05-04, 58 y.o.   MRN: [id number]  Chief Complaint  Patient presents with  . Routine Post Op    HPI Wendy Callahan is a 58 y.o. female here for a post op visit from hernia surgery done on 07/18/18. She reports some drainage from her bellybutton since Sunday. The drainage is light yellow in color. She denies any increased pain or discomfort, redness, or tenderness.  HPI  Past Medical History:  Diagnosis Date  . Allergy   . Arthritis   . Environmental allergies   . GERD (gastroesophageal reflux disease)   . Hypercholesterolemia   . Hypertension   . Murmur   . Pre-diabetes   . Seizure ([place])    AS A CHILD    Past Surgical History:  Procedure Laterality Date  . BLADDER SURGERY  1973  . COLONOSCOPY  2014   Dr Vira Agar  . TUBAL LIGATION  10/1991  . UPPER GI ENDOSCOPY  2015   Dr Vira Agar  . VENTRAL HERNIA REPAIR N/A 07/18/2018   10 x 12 cm Bard soft mesh retrorectus space HERNIA REPAIR VENTRAL ADULT WITH COMPONENT SEPERATION;  Surgeon: Robert Bellow, MD;  Location: [facility] ORS;  Service: General;  Laterality: N/A;    Family History  Adopted: Yes  Problem Relation Age of Onset  . Congenital heart disease Son        Tetrology of fallot    Social History Social History   Tobacco Use  . Smoking status: Former Smoker    Types: Cigarettes  . Smokeless tobacco: Never Used  Substance Use Topics  . Alcohol use: Yes    Alcohol/week: 0.0 standard drinks    Comment: occasional  . Drug use: No    Allergies  Allergen Reactions  . Latex     Chaps skin  . Tetracyclines & Related Other (See Comments)    Yeast infection    Current Outpatient Medications  Medication Sig Dispense Refill  . Acetaminophen (TYLENOL ARTHRITIS PAIN PO) Take 1,300 mg by mouth 2 (two) times daily.     Marland Kitchen acyclovir ointment (ZOVIRAX) 5 % Apply topical as directed (Patient taking differently: Apply 1 application topically daily as needed (fever blisters). Apply  topical as directed) 15 g 0  . fexofenadine (ALLEGRA) 180 MG tablet Take 180 mg by mouth daily.    Marland Kitchen losartan (COZAAR) 50 MG tablet Take 1 tablet (50 mg total) by mouth daily. (Patient taking differently: Take 50 mg by mouth at bedtime. ) 90 tablet 1  . metFORMIN (GLUCOPHAGE) 1000 MG tablet Take 1 tablet (1,000 mg total) by mouth 2 (two) times daily with a meal. 180 tablet 1  . omeprazole (PRILOSEC) 20 MG capsule Take 20 mg by mouth every morning.     . pimecrolimus (ELIDEL) 1 % cream Apply topically 2 (two) times daily as needed. (Patient taking differently: Apply 1 application topically 2 (two) times daily as needed (eczema). ) 60 g 0   No current facility-administered medications for this visit.     Review of Systems Review of Systems  Constitutional: Negative.   Respiratory: Negative.   Cardiovascular: Negative.   Gastrointestinal: Negative.   Genitourinary: Negative.     Blood pressure 132/79, pulse 78, temperature 97.9 F (36.6 C), resp. rate 14, height 5\' 6"  (1.676 m), weight 286 lb (129.7 kg), last menstrual period 05/18/2016, SpO2 98 %.  Physical Exam Physical Exam Abdominal:       Comments:  Assessment Doing well post ventral hernia repair.  Small focal area of superficial epithelial loss.  Plan The patient has been asked to make use of Neosporin or triple antibiotic ointment on the open area until it heals.  She has been asked to refrain from lifting over 20 pounds.  She may use weights involving one arm or leg at a time as desired.  No core strengthening exercises at this time.  Follow up in one month  HPI, Physical Exam, Assessment and Plan have been scribed under the direction and in the presence of Robert Bellow, MD  Concepcion Living, LPN  I have completed the exam and reviewed the above documentation for accuracy and completeness.  I agree with the above.  Haematologist has been used and any errors in dictation or transcription are  unintentional.  Hervey Ard, M.D., F.A.C.S.  Forest Gleason Nazaire Cordial 08/09/2018, 7:44 PM

## 2018-08-09 NOTE — Patient Instructions (Addendum)
May use Neosporin twice daily to the surgical area and a large Band-Aid to cover. You may notice some blood, that is normal.   You may increase activities as tolerated. You may do lifting with one arm or one leg at a time.   Follow up in one month.

## 2018-08-24 ENCOUNTER — Encounter: Payer: Self-pay | Admitting: General Surgery

## 2018-09-13 ENCOUNTER — Ambulatory Visit (INDEPENDENT_AMBULATORY_CARE_PROVIDER_SITE_OTHER): Payer: 59 | Admitting: General Surgery

## 2018-09-13 ENCOUNTER — Encounter: Payer: 59 | Admitting: General Surgery

## 2018-09-13 ENCOUNTER — Other Ambulatory Visit: Payer: Self-pay

## 2018-09-13 ENCOUNTER — Encounter: Payer: Self-pay | Admitting: General Surgery

## 2018-09-13 VITALS — BP 134/69 | HR 78 | Temp 97.2°F | Resp 12 | Ht 68.0 in | Wt 272.0 lb

## 2018-09-13 DIAGNOSIS — K439 Ventral hernia without obstruction or gangrene: Secondary | ICD-10-CM

## 2018-09-13 NOTE — Progress Notes (Signed)
Patient ID: Wendy Callahan, female   DOB: 1960-12-04, 58 y.o.   MRN: [id number]  Chief Complaint  Patient presents with  . Routine Post Op    HPI Wendy Callahan is a 58 y.o. female here today for her post op ventral hernia repair done 07/18/2018. Patient states she is doing well.  HPI  Past Medical History:  Diagnosis Date  . Allergy   . Arthritis   . Environmental allergies   . GERD (gastroesophageal reflux disease)   . Hypercholesterolemia   . Hypertension   . Murmur   . Pre-diabetes   . Seizure ([place])    AS A CHILD    Past Surgical History:  Procedure Laterality Date  . BLADDER SURGERY  1973  . COLONOSCOPY  2014   Dr Vira Agar  . TUBAL LIGATION  10/1991  . UPPER GI ENDOSCOPY  2015   Dr Vira Agar  . VENTRAL HERNIA REPAIR N/A 07/18/2018   10 x 12 cm Bard soft mesh retrorectus space HERNIA REPAIR VENTRAL ADULT WITH COMPONENT SEPERATION;  Surgeon: Robert Bellow, MD;  Location: [facility] ORS;  Service: General;  Laterality: N/A;    Family History  Adopted: Yes  Problem Relation Age of Onset  . Congenital heart disease Son        Tetrology of fallot    Social History Social History   Tobacco Use  . Smoking status: Former Smoker    Types: Cigarettes  . Smokeless tobacco: Never Used  Substance Use Topics  . Alcohol use: Yes    Alcohol/week: 0.0 standard drinks    Comment: occasional  . Drug use: No    Allergies  Allergen Reactions  . Latex     Chaps skin  . Tetracyclines & Related Other (See Comments)    Yeast infection    Current Outpatient Medications  Medication Sig Dispense Refill  . Acetaminophen (TYLENOL ARTHRITIS PAIN PO) Take 1,300 mg by mouth 2 (two) times daily.     Marland Kitchen acyclovir ointment (ZOVIRAX) 5 % Apply topical as directed (Patient taking differently: Apply 1 application topically daily as needed (fever blisters). Apply topical as directed) 15 g 0  . fexofenadine (ALLEGRA) 180 MG tablet Take 180 mg by mouth daily.    Marland Kitchen losartan (COZAAR) 50 MG  tablet Take 1 tablet (50 mg total) by mouth daily. (Patient taking differently: Take 50 mg by mouth at bedtime. ) 90 tablet 1  . metFORMIN (GLUCOPHAGE) 1000 MG tablet Take 1 tablet (1,000 mg total) by mouth 2 (two) times daily with a meal. 180 tablet 1  . omeprazole (PRILOSEC) 20 MG capsule Take 20 mg by mouth every morning.     . pimecrolimus (ELIDEL) 1 % cream Apply topically 2 (two) times daily as needed. (Patient taking differently: Apply 1 application topically 2 (two) times daily as needed (eczema). ) 60 g 0   No current facility-administered medications for this visit.     Review of Systems Review of Systems  Blood pressure 134/69, pulse 78, temperature (!) 97.2 F (36.2 C), temperature source Skin, resp. rate 12, height 5\' 8"  (1.727 m), weight 272 lb (123.4 kg), last menstrual period 05/18/2016, SpO2 98 %.  Physical Exam Physical Exam Abdominal:          Assessment Doing well post retrorectus mesh placement for vental hernia repair.   Plan Return as needed.The patient is aware to call back for any questions or concerns.   HPI, Physical Exam, Assessment and Plan have been scribed under the direction  and in the presence of Hervey Ard, MD.  Gaspar Cola, CMA  I have completed the exam and reviewed the above documentation for accuracy and completeness.  I agree with the above.  Haematologist has been used and any errors in dictation or transcription are unintentional.  Hervey Ard, M.D., F.A.C.S.  Forest Gleason Kymberlyn Eckford 09/15/2018, 3:09 PM

## 2018-09-13 NOTE — Patient Instructions (Signed)
Return as needed.The patient is aware to call back for any questions or concerns.  

## 2018-09-21 LAB — HM DIABETES EYE EXAM

## 2018-09-22 ENCOUNTER — Encounter: Payer: Self-pay | Admitting: Internal Medicine

## 2018-09-22 ENCOUNTER — Ambulatory Visit (INDEPENDENT_AMBULATORY_CARE_PROVIDER_SITE_OTHER): Payer: 59 | Admitting: Internal Medicine

## 2018-09-22 ENCOUNTER — Other Ambulatory Visit: Payer: Self-pay

## 2018-09-22 DIAGNOSIS — I10 Essential (primary) hypertension: Secondary | ICD-10-CM

## 2018-09-22 DIAGNOSIS — E7800 Pure hypercholesterolemia, unspecified: Secondary | ICD-10-CM

## 2018-09-22 DIAGNOSIS — K219 Gastro-esophageal reflux disease without esophagitis: Secondary | ICD-10-CM | POA: Diagnosis not present

## 2018-09-22 DIAGNOSIS — E119 Type 2 diabetes mellitus without complications: Secondary | ICD-10-CM

## 2018-09-22 DIAGNOSIS — K439 Ventral hernia without obstruction or gangrene: Secondary | ICD-10-CM

## 2018-09-22 NOTE — Progress Notes (Addendum)
Patient ID: Wendy Callahan, female   DOB: 17-Jun-1960, 58 y.o.   MRN: [id number]   Virtual Visit via Video Note  This visit type was conducted due to national recommendations for restrictions regarding the COVID-19 pandemic (e.g. social distancing).  This format is felt to be most appropriate for this patient at this time.  All issues noted in this document were discussed and addressed.  No physical exam was performed (except for noted visual exam findings with Video Visits).   I connected with Wendy Callahan by a video enabled telemedicine application and verified that I am speaking with the correct person using two identifiers. Location patient: home Location provider: work Persons participating in the virtual visit: patient, provider  I discussed the limitations, risks, security and privacy concerns of performing an evaluation and management service by and the availability of in person appointments. The patient expressed understanding and agreed to proceed.   Reason for visit: scheduled follow up.    HPI: She is doing well.  Feels good.  Working from home and this is going well.  Handling stress.  Trying to stay in due to COVID restrictions.  No fever.  No cough, chest congestion or sob.  No chest pain.  No acid reflux.  No abdominal pain.  Bowels moving.  No urine change.  Discussed diet and exercise and need for weight loss.  She has started walking.  Weight 288 pounds.  Checking her sugars 2x/week.  Sugars averaging < 130. Had eye exam yesterday.  Is s/p ventral herna repair 07/18/18.  Recovering well.     ROS: See pertinent positives and negatives per HPI.  Past Medical History:  Diagnosis Date  . Allergy   . Arthritis   . Environmental allergies   . GERD (gastroesophageal reflux disease)   . Hypercholesterolemia   . Hypertension   . Murmur   . Pre-diabetes   . Seizure ([place])    AS A CHILD    Past Surgical History:  Procedure Laterality Date  . BLADDER SURGERY  1973  .  COLONOSCOPY  2014   Dr Vira Agar  . TUBAL LIGATION  10/1991  . UPPER GI ENDOSCOPY  2015   Dr Vira Agar  . VENTRAL HERNIA REPAIR N/A 07/18/2018   10 x 12 cm Bard soft mesh retrorectus space HERNIA REPAIR VENTRAL ADULT WITH COMPONENT SEPERATION;  Surgeon: Robert Bellow, MD;  Location: [facility] ORS;  Service: General;  Laterality: N/A;    Family History  Adopted: Yes  Problem Relation Age of Onset  . Congenital heart disease Son        Tetrology of fallot    SOCIAL HX: reviewed.    Current Outpatient Medications:  .  Acetaminophen (TYLENOL ARTHRITIS PAIN PO), Take 1,300 mg by mouth 2 (two) times daily. , Disp: , Rfl:  .  acyclovir ointment (ZOVIRAX) 5 %, Apply topical as directed (Patient taking differently: Apply 1 application topically daily as needed (fever blisters). Apply topical as directed), Disp: 15 g, Rfl: 0 .  fexofenadine (ALLEGRA) 180 MG tablet, Take 180 mg by mouth daily., Disp: , Rfl:  .  losartan (COZAAR) 50 MG tablet, Take 1 tablet (50 mg total) by mouth daily. (Patient taking differently: Take 50 mg by mouth at bedtime. ), Disp: 90 tablet, Rfl: 1 .  metFORMIN (GLUCOPHAGE) 1000 MG tablet, Take 1 tablet (1,000 mg total) by mouth 2 (two) times daily with a meal., Disp: 180 tablet, Rfl: 1 .  omeprazole (PRILOSEC) 20 MG capsule, Take 20 mg by  mouth every morning. , Disp: , Rfl:  .  pimecrolimus (ELIDEL) 1 % cream, Apply topically 2 (two) times daily as needed. (Patient taking differently: Apply 1 application topically 2 (two) times daily as needed (eczema). ), Disp: 60 g, Rfl: 0  EXAM:  VITALS per patient if applicable: weight 883 pounds.   GENERAL: alert, oriented, appears well and in no acute distress  HEENT: atraumatic, conjunttiva clear, no obvious abnormalities on inspection of external nose and ears  NECK: normal movements of the head and neck  LUNGS: on inspection no signs of respiratory distress, breathing rate appears normal, no obvious gross SOB, gasping or  wheezing  CV: no obvious cyanosis  PSYCH/NEURO: pleasant and cooperative, no obvious depression or anxiety, speech and thought processing grossly intact  ASSESSMENT AND PLAN:  Discussed the following assessment and plan:  Type 2 diabetes mellitus without complication, without long-term current use of insulin ([place]) - Plan: Hemoglobin A1c, Microalbumin / creatinine urine ratio  Essential hypertension, benign - Plan: CBC with Differential/Platelet, TSH, Basic metabolic panel  Gastroesophageal reflux disease without esophagitis  Hypercholesterolemia - Plan: Hepatic function panel, Lipid panel  Ventral hernia without obstruction or gangrene  Diabetes mellitus Low carb diet and exercise.  Sugars as outlined.  Follow met b and a1c.  Last a1c 6.6.  Had eye exam yesterday.   Essential hypertension, benign Reports blood pressure has been doing well.  Continue current medication regimen.  Follow pressures.  Follow metabolic panel.    GERD (gastroesophageal reflux disease) Controlled on current regimen.  Follow.   Hypercholesterolemia Low cholesterol diet and exercise.  Follow lipid panel.   Ventral hernia without obstruction or gangrene S/p repair.  Doing well.      I discussed the assessment and treatment plan with the patient. The patient was provided an opportunity to ask questions and all were answered. The patient agreed with the plan and demonstrated an understanding of the instructions.   The patient was advised to call back or seek an in-person evaluation if the symptoms worsen or if the condition fails to improve as anticipated.    Einar Pheasant, MD

## 2018-09-25 ENCOUNTER — Encounter: Payer: Self-pay | Admitting: Internal Medicine

## 2018-09-25 NOTE — Assessment & Plan Note (Signed)
Controlled on current regimen.  Follow.  

## 2018-09-25 NOTE — Addendum Note (Signed)
Addended by: Alisa Graff on: 09/25/2018 05:16 PM   Modules accepted: Orders

## 2018-09-25 NOTE — Assessment & Plan Note (Signed)
Low cholesterol diet and exercise.  Follow lipid panel.   

## 2018-09-25 NOTE — Assessment & Plan Note (Signed)
Reports blood pressure has been doing well.  Continue current medication regimen.  Follow pressures.  Follow metabolic panel.

## 2018-09-25 NOTE — Assessment & Plan Note (Signed)
S/p repair  Doing well

## 2018-09-25 NOTE — Assessment & Plan Note (Signed)
Low carb diet and exercise.  Sugars as outlined.  Follow met b and a1c.  Last a1c 6.6.  Had eye exam yesterday.

## 2018-10-26 ENCOUNTER — Encounter: Payer: Self-pay | Admitting: Internal Medicine

## 2018-10-26 ENCOUNTER — Other Ambulatory Visit: Payer: Self-pay

## 2018-10-26 MED ORDER — LOSARTAN POTASSIUM 50 MG PO TABS: 50.0000 mg | ORAL_TABLET | Freq: Every day | ORAL | 1 refills | Status: DC

## 2018-11-29 ENCOUNTER — Other Ambulatory Visit: Payer: 59

## 2018-11-29 ENCOUNTER — Encounter: Payer: Self-pay | Admitting: Internal Medicine

## 2018-11-30 NOTE — Telephone Encounter (Signed)
Labs need to be switched to [employer]

## 2019-01-03 ENCOUNTER — Ambulatory Visit (INDEPENDENT_AMBULATORY_CARE_PROVIDER_SITE_OTHER): Payer: 59 | Admitting: Internal Medicine

## 2019-01-03 ENCOUNTER — Encounter: Payer: Self-pay | Admitting: Internal Medicine

## 2019-01-03 ENCOUNTER — Other Ambulatory Visit: Payer: Self-pay

## 2019-01-03 DIAGNOSIS — E119 Type 2 diabetes mellitus without complications: Secondary | ICD-10-CM

## 2019-01-03 DIAGNOSIS — K219 Gastro-esophageal reflux disease without esophagitis: Secondary | ICD-10-CM

## 2019-01-03 DIAGNOSIS — E7800 Pure hypercholesterolemia, unspecified: Secondary | ICD-10-CM | POA: Diagnosis not present

## 2019-01-03 DIAGNOSIS — I10 Essential (primary) hypertension: Secondary | ICD-10-CM

## 2019-01-03 MED ORDER — METFORMIN HCL 1000 MG PO TABS: 1000.0000 mg | ORAL_TABLET | Freq: Two times a day (BID) | ORAL | 1 refills | Status: DC

## 2019-01-03 MED ORDER — LOSARTAN POTASSIUM 50 MG PO TABS: 50.0000 mg | ORAL_TABLET | Freq: Every day | ORAL | 1 refills | Status: DC

## 2019-01-03 NOTE — Progress Notes (Signed)
Patient ID: Wendy Callahan, female   DOB: 02-07-1961, 58 y.o.   MRN: [id number]   Virtual Visit via video Note  This visit type was conducted due to national recommendations for restrictions regarding the COVID-19 pandemic (e.g. social distancing).  This format is felt to be most appropriate for this patient at this time.  All issues noted in this document were discussed and addressed.  No physical exam was performed (except for noted visual exam findings with Video Visits).   I connected with [name] by a video enabled telemedicine application and verified that I am speaking with the correct person using two identifiers. Location patient: home Location provider: work  Persons participating in the virtual visit: patient, provider  I discussed the limitations, risks, security and privacy concerns of performing an evaluation and management service by video and the availability of in person appointments.  The patient expressed understanding and agreed to proceed.   Reason for visit: scheduled follow up.    HPI: She reports she is doing relatively well.  Working from home.  Discussed diet and exercise.  States blood sugar this am 122.  No chest pain.  No sob.  No acid reflux. No abdominal pain.  Bowels moving.  No urine change.  S/p ventral hernia repair.  Did well.  Overall feels good.     ROS: See pertinent positives and negatives per HPI.  Past Medical History:  Diagnosis Date  . Allergy   . Arthritis   . Environmental allergies   . GERD (gastroesophageal reflux disease)   . Hypercholesterolemia   . Hypertension   . Murmur   . Pre-diabetes   . Seizure ([place])    AS A CHILD    Past Surgical History:  Procedure Laterality Date  . BLADDER SURGERY  1973  . COLONOSCOPY  2014   Dr Vira Agar  . TUBAL LIGATION  10/1991  . UPPER GI ENDOSCOPY  2015   Dr Vira Agar  . VENTRAL HERNIA REPAIR N/A 07/18/2018   10 x 12 cm Bard soft mesh retrorectus space HERNIA REPAIR VENTRAL ADULT WITH  COMPONENT SEPERATION;  Surgeon: Robert Bellow, MD;  Location: [facility] ORS;  Service: General;  Laterality: N/A;    Family History  Adopted: Yes  Problem Relation Age of Onset  . Congenital heart disease Son        Tetrology of fallot    SOCIAL HX: reviewed.    Current Outpatient Medications:  .  Acetaminophen (TYLENOL ARTHRITIS PAIN PO), Take 1,300 mg by mouth 2 (two) times daily. , Disp: , Rfl:  .  acyclovir ointment (ZOVIRAX) 5 %, Apply topical as directed (Patient taking differently: Apply 1 application topically daily as needed (fever blisters). Apply topical as directed), Disp: 15 g, Rfl: 0 .  fexofenadine (ALLEGRA) 180 MG tablet, Take 180 mg by mouth daily., Disp: , Rfl:  .  losartan (COZAAR) 50 MG tablet, Take 1 tablet (50 mg total) by mouth daily., Disp: 90 tablet, Rfl: 1 .  metFORMIN (GLUCOPHAGE) 1000 MG tablet, Take 1 tablet (1,000 mg total) by mouth 2 (two) times daily with a meal., Disp: 180 tablet, Rfl: 1 .  omeprazole (PRILOSEC) 20 MG capsule, Take 20 mg by mouth every morning. , Disp: , Rfl:  .  pimecrolimus (ELIDEL) 1 % cream, Apply topically 2 (two) times daily as needed. (Patient taking differently: Apply 1 application topically 2 (two) times daily as needed (eczema). ), Disp: 60 g, Rfl: 0  EXAM:  GENERAL: alert, oriented, appears well and  in no acute distress  HEENT: atraumatic, conjunttiva clear, no obvious abnormalities on inspection of external nose and ears  NECK: normal movements of the head and neck  LUNGS: on inspection no signs of respiratory distress, breathing rate appears normal, no obvious gross SOB, gasping or wheezing  CV: no obvious cyanosis  PSYCH/NEURO: pleasant and cooperative, no obvious depression or anxiety, speech and thought processing grossly intact  ASSESSMENT AND PLAN:  Discussed the following assessment and plan:  Diabetes mellitus Low carb diet and exercise.  Follow met b and a1c.    Essential hypertension, benign Blood  pressure has been doing well.  Continue current medication regimen.  Follow pressures.  follow  GERD (gastroesophageal reflux disease) Controlled.    Hypercholesterolemia Low cholesterol diet and exercise.  Discuss starting a cholesterol medication.  Follow lipid panel.      I discussed the assessment and treatment plan with the patient. The patient was provided an opportunity to ask questions and all were answered. The patient agreed with the plan and demonstrated an understanding of the instructions.   The patient was advised to call back or seek an in-person evaluation if the symptoms worsen or if the condition fails to improve as anticipated.   Einar Pheasant, MD

## 2019-01-08 ENCOUNTER — Encounter: Payer: Self-pay | Admitting: Internal Medicine

## 2019-01-08 NOTE — Assessment & Plan Note (Signed)
Low carb diet and exercise.  Follow met b and a1c.   

## 2019-01-08 NOTE — Assessment & Plan Note (Signed)
Low cholesterol diet and exercise.  Discuss starting a cholesterol medication.  Follow lipid panel.

## 2019-01-08 NOTE — Assessment & Plan Note (Signed)
Controlled.  

## 2019-01-08 NOTE — Assessment & Plan Note (Signed)
Blood pressure has been doing well.  Continue current medication regimen.  Follow pressures.  follow

## 2019-01-10 ENCOUNTER — Telehealth: Payer: Self-pay | Admitting: Internal Medicine

## 2019-01-10 ENCOUNTER — Telehealth: Payer: Self-pay

## 2019-01-10 NOTE — Telephone Encounter (Signed)
Patient declined cholesterol medication

## 2019-01-10 NOTE — Telephone Encounter (Signed)
-----   Message from Einar Pheasant, MD sent at 01/08/2019  9:07 PM EDT ----- Regarding: cholesterol medication Please call and notify pt that given her history of diabetes, it is recommended for her to start cholesterol medication (for prevention).  If agreeable, I would like to start crestor 10mg  q day.  Will need liver panel checked 6 weeks after starting cholesterol medication.    Dr Nicki Reaper

## 2019-01-10 NOTE — Telephone Encounter (Signed)
See other phone note

## 2019-01-10 NOTE — Telephone Encounter (Signed)
Patient retuning Wendy Callahan call and is requesting a callback

## 2019-01-10 NOTE — Telephone Encounter (Signed)
LMTCB

## 2019-03-18 LAB — HEPATIC FUNCTION PANEL
ALT: 20 IU/L (ref 0–32)
AST: 18 IU/L (ref 0–40)
Albumin: 4.6 g/dL (ref 3.8–4.9)
Alkaline Phosphatase: 120 IU/L — ABNORMAL HIGH (ref 39–117)
Bilirubin Total: 0.4 mg/dL (ref 0.0–1.2)
Bilirubin, Direct: 0.1 mg/dL (ref 0.00–0.40)
Total Protein: 7.2 g/dL (ref 6.0–8.5)

## 2019-03-18 LAB — CBC WITH DIFFERENTIAL/PLATELET
Basophils Absolute: 0 10*3/uL (ref 0.0–0.2)
Basos: 0 %
EOS (ABSOLUTE): 0.3 10*3/uL (ref 0.0–0.4)
Eos: 4 %
Hematocrit: 42.7 % (ref 34.0–46.6)
Hemoglobin: 14.6 g/dL (ref 11.1–15.9)
Immature Grans (Abs): 0 10*3/uL (ref 0.0–0.1)
Immature Granulocytes: 0 %
Lymphocytes Absolute: 2.5 10*3/uL (ref 0.7–3.1)
Lymphs: 32 %
MCH: 31.1 pg (ref 26.6–33.0)
MCHC: 34.2 g/dL (ref 31.5–35.7)
MCV: 91 fL (ref 79–97)
Monocytes Absolute: 0.5 10*3/uL (ref 0.1–0.9)
Monocytes: 7 %
Neutrophils Absolute: 4.4 10*3/uL (ref 1.4–7.0)
Neutrophils: 57 %
Platelets: 245 10*3/uL (ref 150–450)
RBC: 4.69 x10E6/uL (ref 3.77–5.28)
RDW: 12.9 % (ref 11.7–15.4)
WBC: 7.7 10*3/uL (ref 3.4–10.8)

## 2019-03-18 LAB — BASIC METABOLIC PANEL
BUN/Creatinine Ratio: 18 (ref 9–23)
BUN: 12 mg/dL (ref 6–24)
CO2: 21 mmol/L (ref 20–29)
Calcium: 10 mg/dL (ref 8.7–10.2)
Chloride: 101 mmol/L (ref 96–106)
Creatinine, Ser: 0.68 mg/dL (ref 0.57–1.00)
GFR calc Af Amer: 113 mL/min/{1.73_m2} (ref >59)
GFR calc non Af Amer: 98 mL/min/{1.73_m2} (ref >59)
Glucose: 137 mg/dL — ABNORMAL HIGH (ref 65–99)
Potassium: 5.2 mmol/L (ref 3.5–5.2)
Sodium: 137 mmol/L (ref 134–144)

## 2019-03-18 LAB — LIPID PANEL
Chol/HDL Ratio: 3.5 ratio (ref 0.0–4.4)
Cholesterol, Total: 276 mg/dL — ABNORMAL HIGH (ref 100–199)
HDL: 80 mg/dL (ref >39)
LDL Chol Calc (NIH): 150 mg/dL — ABNORMAL HIGH (ref 0–99)
Triglycerides: 254 mg/dL — ABNORMAL HIGH (ref 0–149)
VLDL Cholesterol Cal: 46 mg/dL — ABNORMAL HIGH (ref 5–40)

## 2019-03-18 LAB — MICROALBUMIN / CREATININE URINE RATIO
Creatinine, Urine: 210.7 mg/dL
Microalb/Creat Ratio: 38 mg/g creat — ABNORMAL HIGH (ref 0–29)
Microalbumin, Urine: 79.8 ug/mL

## 2019-03-18 LAB — TSH: TSH: 2.39 u[IU]/mL (ref 0.450–4.500)

## 2019-03-18 LAB — HEMOGLOBIN A1C
Est. average glucose Bld gHb Est-mCnc: 126 mg/dL
Hgb A1c MFr Bld: 6 % — ABNORMAL HIGH (ref 4.8–5.6)

## 2019-05-23 ENCOUNTER — Encounter: Payer: Self-pay | Admitting: Internal Medicine

## 2019-05-24 MED ORDER — ACYCLOVIR 5 % EX OINT: 1.0000 "application " | TOPICAL_OINTMENT | Freq: Every day | CUTANEOUS | 0 refills | Status: DC | PRN

## 2019-05-24 NOTE — Telephone Encounter (Signed)
rx sent in for zovirax ointment.

## 2019-05-24 NOTE — Telephone Encounter (Signed)
Are you okay with me sending this in for her? Last refill 2017.

## 2019-05-26 ENCOUNTER — Ambulatory Visit (INDEPENDENT_AMBULATORY_CARE_PROVIDER_SITE_OTHER): Payer: 59 | Admitting: Internal Medicine

## 2019-05-26 ENCOUNTER — Other Ambulatory Visit: Payer: Self-pay

## 2019-05-26 ENCOUNTER — Encounter: Payer: Self-pay | Admitting: Internal Medicine

## 2019-05-26 VITALS — Ht 66.0 in

## 2019-05-26 DIAGNOSIS — K219 Gastro-esophageal reflux disease without esophagitis: Secondary | ICD-10-CM | POA: Diagnosis not present

## 2019-05-26 DIAGNOSIS — Z86010 Personal history of colonic polyps: Secondary | ICD-10-CM

## 2019-05-26 DIAGNOSIS — I10 Essential (primary) hypertension: Secondary | ICD-10-CM

## 2019-05-26 DIAGNOSIS — Z1231 Encounter for screening mammogram for malignant neoplasm of breast: Secondary | ICD-10-CM

## 2019-05-26 DIAGNOSIS — E119 Type 2 diabetes mellitus without complications: Secondary | ICD-10-CM | POA: Diagnosis not present

## 2019-05-26 DIAGNOSIS — E7800 Pure hypercholesterolemia, unspecified: Secondary | ICD-10-CM

## 2019-05-26 NOTE — Progress Notes (Signed)
Patient ID: Wendy Callahan, female   DOB: 07-Oct-1960, 59 y.o.   MRN: [id number]   Virtual Visit via video Note  This visit type was conducted due to national recommendations for restrictions regarding the COVID-19 pandemic (e.g. social distancing).  This format is felt to be most appropriate for this patient at this time.  All issues noted in this document were discussed and addressed.  No physical exam was performed (except for noted visual exam findings with Video Visits).   I connected with Wendy Callahan by a video enabled telemedicine application and verified that I am speaking with the correct person using two identifiers. Location patient: home Location provider: work  Persons participating in the virtual visit: patient, provider  The limitations, risks, security and privacy concerns of performing an evaluation and management service by video and the availability of in person appointments have been discussed.  The patient expressed understanding and agreed to proceed.   Reason for visit: scheduled follow up.   HPI: She reports she is doing well.  Feels good.  Working from home.  This is going well.  Trying to stay active.  No chest pain or sob reported.  Doing well s/p ventral hernia repair.  Had questions about her scar.  No pain.  No abdominal pain.  Bowels stable.  Taking prilosec.  Controls acid reflux.  Blood sugars 110-125.  Is walking.  Discussed mammogram.  She will call and schedule.     ROS: See pertinent positives and negatives per HPI.  Past Medical History:  Diagnosis Date  . Allergy   . Arthritis   . Environmental allergies   . GERD (gastroesophageal reflux disease)   . Hypercholesterolemia   . Hypertension   . Murmur   . Pre-diabetes   . Seizure ([place])    AS A CHILD    Past Surgical History:  Procedure Laterality Date  . BLADDER SURGERY  1973  . COLONOSCOPY  2014   Dr Wendy Callahan  . TUBAL LIGATION  10/1991  . UPPER GI ENDOSCOPY  2015   Dr Wendy Callahan  . VENTRAL  HERNIA REPAIR N/A 07/18/2018   10 x 12 cm Bard soft mesh retrorectus space HERNIA REPAIR VENTRAL ADULT WITH COMPONENT SEPERATION;  Surgeon: Wendy Bellow, MD;  Location: [facility] ORS;  Service: General;  Laterality: N/A;    Family History  Adopted: Yes  Problem Relation Age of Onset  . Congenital heart disease Son        Tetrology of fallot    SOCIAL HX: reviewed.    Current Outpatient Medications:  .  Acetaminophen (TYLENOL ARTHRITIS PAIN PO), Take 1,300 mg by mouth 2 (two) times daily. , Disp: , Rfl:  .  acyclovir ointment (ZOVIRAX) 5 %, Apply 1 application topically daily as needed (fever blisters). Apply topical as directed, Disp: 15 g, Rfl: 0 .  fexofenadine (ALLEGRA) 180 MG tablet, Take 180 mg by mouth daily., Disp: , Rfl:  .  losartan (COZAAR) 50 MG tablet, Take 1 tablet (50 mg total) by mouth daily., Disp: 90 tablet, Rfl: 1 .  metFORMIN (GLUCOPHAGE) 1000 MG tablet, Take 1 tablet (1,000 mg total) by mouth 2 (two) times daily with a meal., Disp: 180 tablet, Rfl: 1 .  omeprazole (PRILOSEC) 20 MG capsule, Take 20 mg by mouth every morning. , Disp: , Rfl:  .  pimecrolimus (ELIDEL) 1 % cream, Apply topically 2 (two) times daily as needed. (Patient taking differently: Apply 1 application topically 2 (two) times daily as needed (eczema). ), Disp:  60 g, Rfl: 0  EXAM:  GENERAL: alert, oriented, appears well and in no acute distress  HEENT: atraumatic, conjunttiva clear, no obvious abnormalities on inspection of external nose and ears  NECK: normal movements of the head and neck  LUNGS: on inspection no signs of respiratory distress, breathing rate appears normal, no obvious gross SOB, gasping or wheezing  CV: no obvious cyanosis  PSYCH/NEURO: pleasant and cooperative, no obvious depression or anxiety, speech and thought processing grossly intact  ASSESSMENT AND PLAN:  Discussed the following assessment and plan:  Diabetes mellitus Low carb diet and exercise.  Is walking.   Follow met b and a1c.    Essential hypertension, benign Not checking blood pressure.  Has been doing well.  Continue current medication regimen.  Follow pressures.  Follow metabolic panel.    GERD (gastroesophageal reflux disease) Controlled on current medication regimen.  On prilosec.    History of colonic polyps Colonoscopy 07/2012.  Recommended f/u colonoscopy in 10 years.  Discussed hemoccult cards.    Hypercholesterolemia Low cholesterol diet and exercise.  Discuss cholesterol medication.  Working on diet and exercise.  Follow lipid panel.   Breast cancer screening Schedule mammogram.  She will call and schedule.     Orders Placed This Encounter  Procedures  . MM 3D SCREEN BREAST BILATERAL    Standing Status:   Future    Standing Expiration Date:   07/24/2020    Order Specific Question:   Reason for Exam (SYMPTOM  OR DIAGNOSIS REQUIRED)    Answer:   breast cancer screening    Order Specific Question:   Is the patient pregnant?    Answer:   No    Order Specific Question:   Preferred imaging location?    Answer:   [facility]  . Hepatic function panel    Standing Status:   Future    Standing Expiration Date:   05/26/2020  . Hemoglobin A1c    Standing Status:   Future    Standing Expiration Date:   05/26/2020  . Lipid panel    Standing Status:   Future    Standing Expiration Date:   05/26/2020  . Basic metabolic panel (future)    Standing Status:   Future    Standing Expiration Date:   05/26/2020    Meds ordered this encounter  Medications  . metFORMIN (GLUCOPHAGE) 1000 MG tablet    Sig: Take 1 tablet (1,000 mg total) by mouth 2 (two) times daily with a meal.    Dispense:  180 tablet    Refill:  1  . losartan (COZAAR) 50 MG tablet    Sig: Take 1 tablet (50 mg total) by mouth daily.    Dispense:  90 tablet    Refill:  1     I discussed the assessment and treatment plan with the patient. The patient was provided an opportunity to ask questions and all were answered.  The patient agreed with the plan and demonstrated an understanding of the instructions.   The patient was advised to call back or seek an in-person evaluation if the symptoms worsen or if the condition fails to improve as anticipated.   Wendy Pheasant, MD

## 2019-05-27 ENCOUNTER — Encounter: Payer: Self-pay | Admitting: Internal Medicine

## 2019-05-27 ENCOUNTER — Telehealth: Payer: Self-pay | Admitting: Internal Medicine

## 2019-05-27 DIAGNOSIS — Z1211 Encounter for screening for malignant neoplasm of colon: Secondary | ICD-10-CM

## 2019-05-27 DIAGNOSIS — Z1239 Encounter for other screening for malignant neoplasm of breast: Secondary | ICD-10-CM | POA: Insufficient documentation

## 2019-05-27 MED ORDER — METFORMIN HCL 1000 MG PO TABS: 1000.0000 mg | ORAL_TABLET | Freq: Two times a day (BID) | ORAL | 1 refills | Status: DC

## 2019-05-27 MED ORDER — LOSARTAN POTASSIUM 50 MG PO TABS: 50.0000 mg | ORAL_TABLET | Freq: Every day | ORAL | 1 refills | Status: DC

## 2019-05-27 NOTE — Assessment & Plan Note (Signed)
Not checking blood pressure.  Has been doing well.  Continue current medication regimen.  Follow pressures.  Follow metabolic panel.

## 2019-05-27 NOTE — Assessment & Plan Note (Signed)
Schedule mammogram.  She will call and schedule.

## 2019-05-27 NOTE — Assessment & Plan Note (Signed)
Colonoscopy 07/2012.  Recommended f/u colonoscopy in 10 years.  Discussed hemoccult cards.

## 2019-05-27 NOTE — Assessment & Plan Note (Signed)
Low cholesterol diet and exercise.  Discuss cholesterol medication.  Working on diet and exercise.  Follow lipid panel.

## 2019-05-27 NOTE — Assessment & Plan Note (Signed)
Controlled on current medication regimen.  On prilosec.

## 2019-05-27 NOTE — Assessment & Plan Note (Signed)
Low carb diet and exercise.  Is walking.  Follow met b and a1c.

## 2019-05-27 NOTE — Telephone Encounter (Signed)
I placed order for her mammogram.  She wants to call and schedule.  Please send her phone number to schedule.  Also, she needs hemoccult cards.  I have placed order.  Can send to her or can make note to give to her when she comes in for labs.    Dr Nicki Reaper

## 2019-05-29 NOTE — Telephone Encounter (Signed)
Number given to her to schedule mammogram. She does not come to the office for labs. Are you okay with her coming to pick up stool kit this week?

## 2019-05-29 NOTE — Telephone Encounter (Signed)
As long as she passes screening, I am ok - or can mail to her.

## 2019-05-30 NOTE — Telephone Encounter (Signed)
Kit placed up front for pick up.

## 2019-05-31 NOTE — Telephone Encounter (Signed)
Left message for patient

## 2019-06-13 ENCOUNTER — Encounter: Payer: Self-pay | Admitting: Internal Medicine

## 2019-12-01 IMAGING — US US ABDOMEN COMPLETE
1 series · 14 of 25 positions shown · non-contrast
Comparison: None.

CLINICAL DATA: Generalized abdominal pain

EXAM:
ABDOMEN ULTRASOUND COMPLETE

[Series 1: us abdomen complete · 0.24mm/px · 14 of 77 slices shown]
[im 1/77]
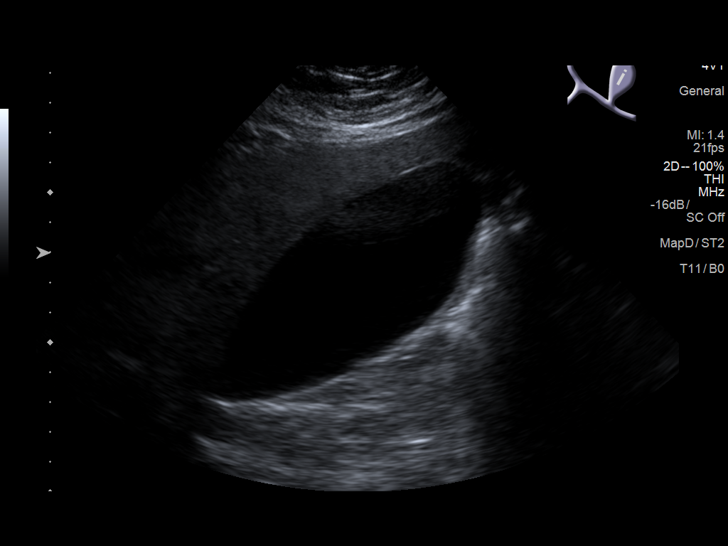
[im 7/77]
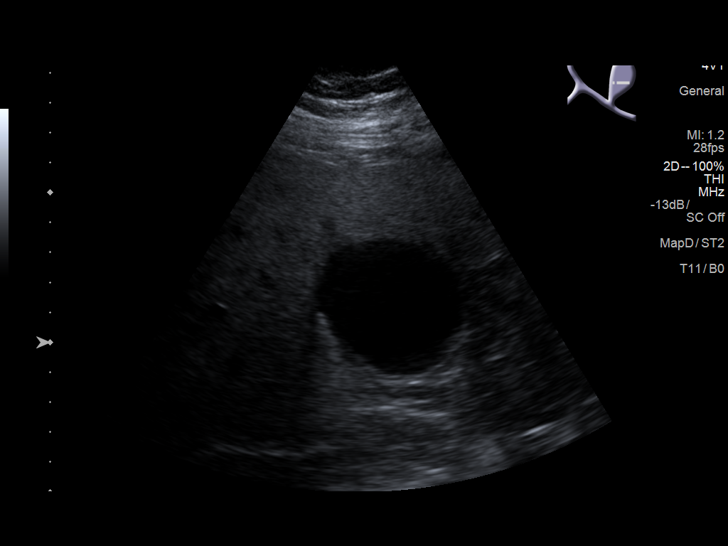
[im 13/77]
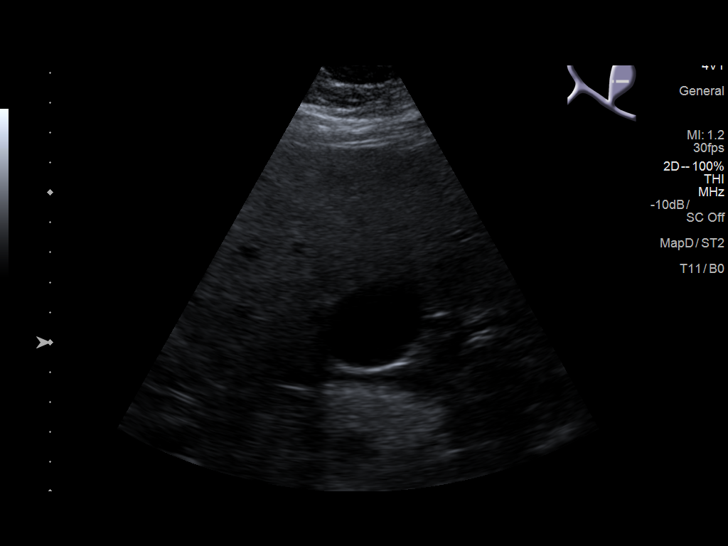
[im 20/77]
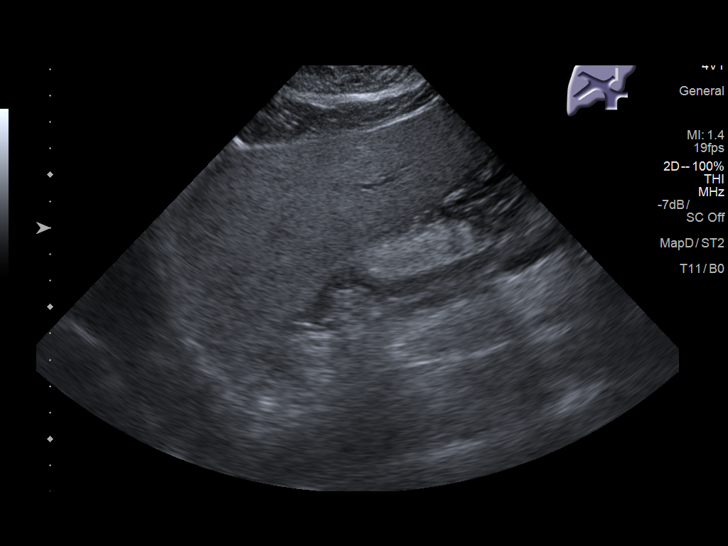
[im 26/77]
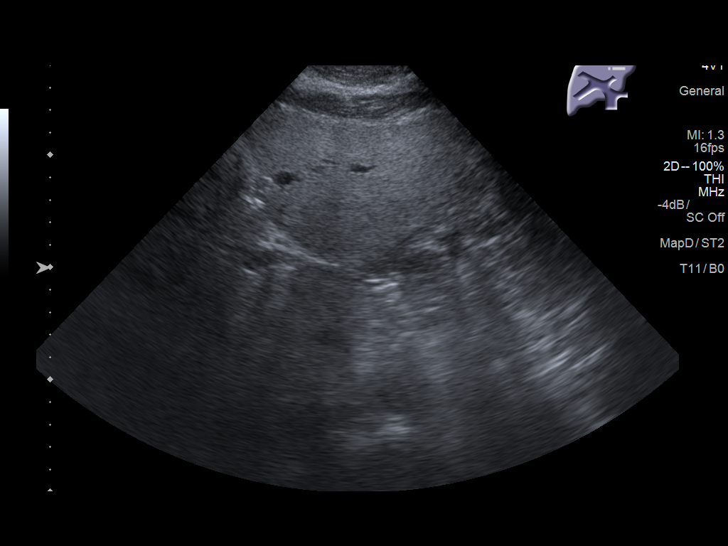
[im 29/77]
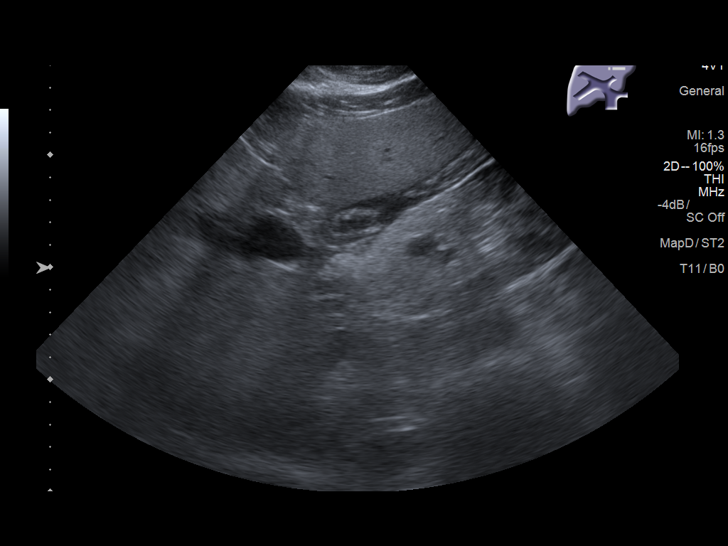
[im 35/77]
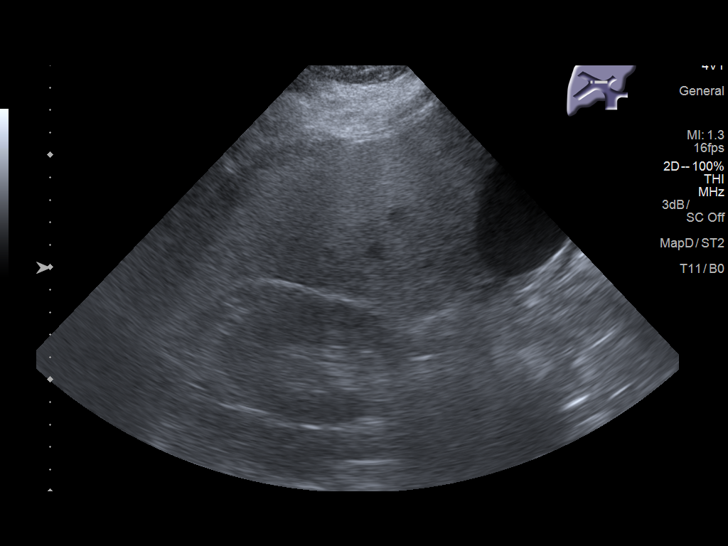
[im 42/77]
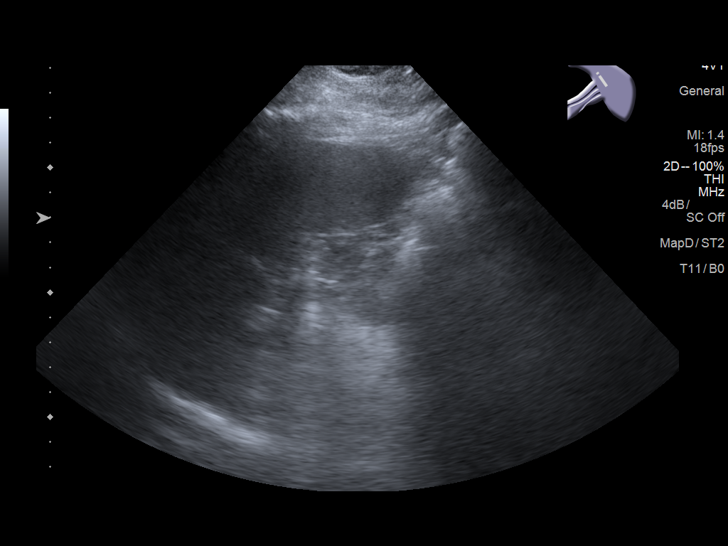
[im 48/77]
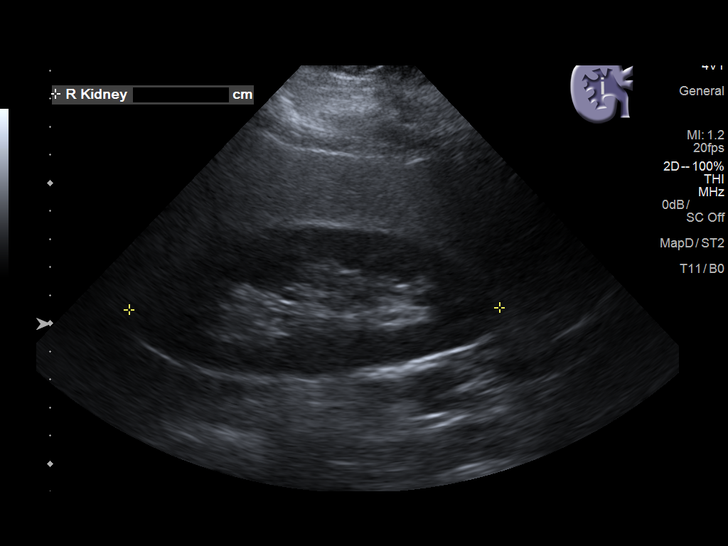
[im 51/77]
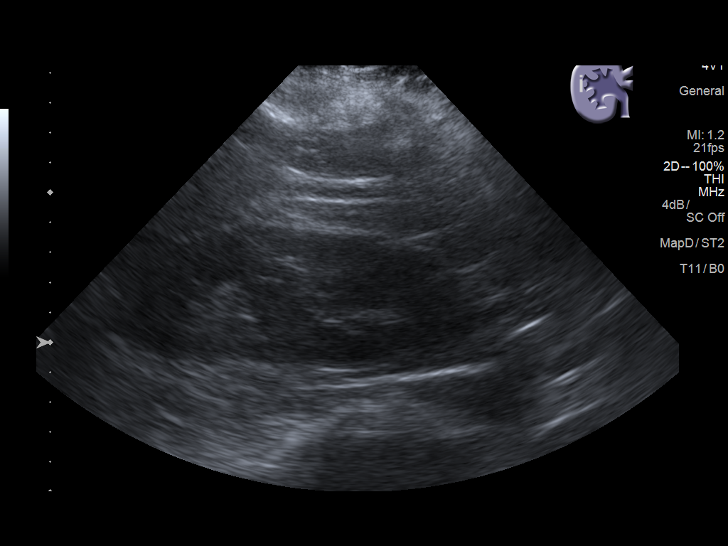
[im 58/77]
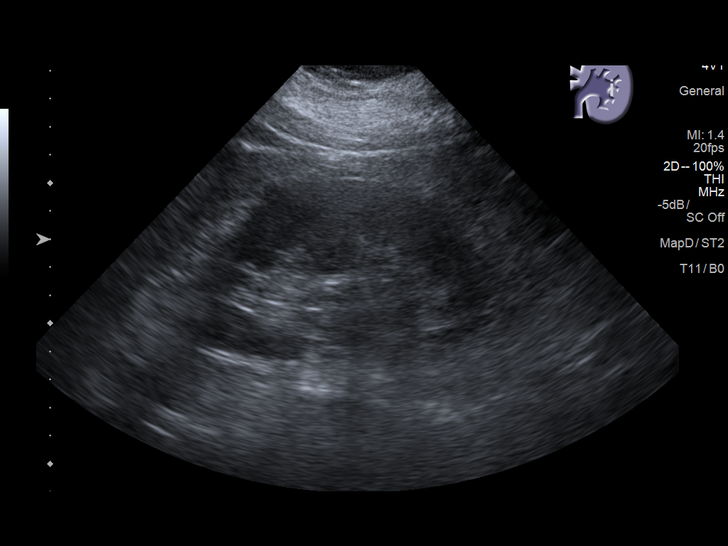
[im 64/77]
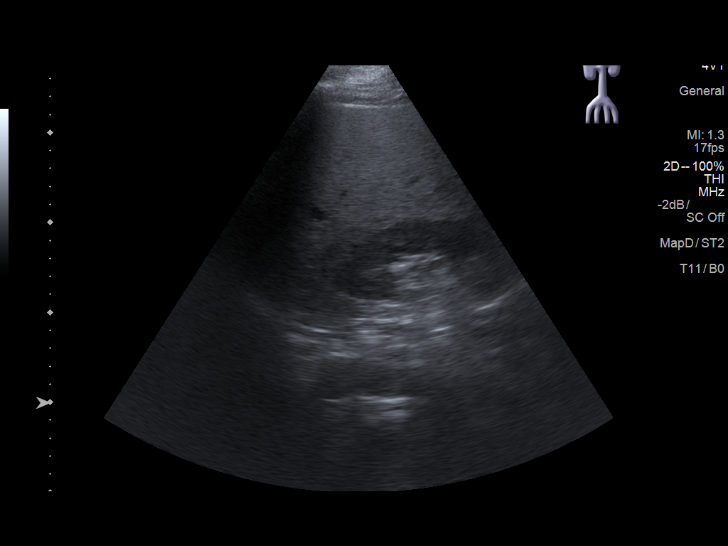
[im 70/77]
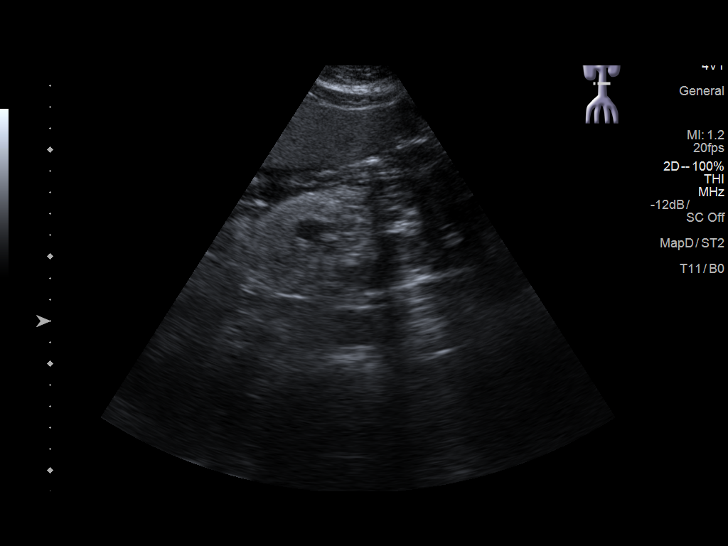
[im 77/77]
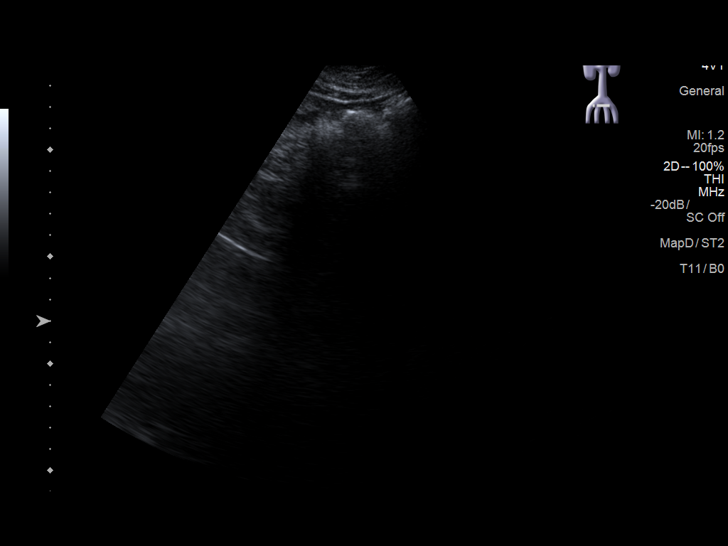

[14 of 25 positions shown; findings below may reference images not displayed]

FINDINGS: Gallbladder: No gallstones or wall thickening visualized. No
sonographic Murphy sign noted by sonographer.

Common bile duct: Diameter: 4 mm

Liver: Echogenic liver with poor acoustic penetration. Portal vein
is patent on color Doppler imaging with normal direction of blood
flow towards the liver.

IVC: No abnormality visualized.

Pancreas: Visualized portion is without convincing abnormality when
allowing for artifact. No ductal dilatation.

Spleen: Size and appearance within normal limits.

Right Kidney: Length: 13 cm. Echogenicity within normal limits. No
mass or hydronephrosis visualized.

Left Kidney: Length: 13 cm. Echogenicity within normal limits. No
mass or hydronephrosis visualized.

Abdominal aorta: No aneurysm visualized.
IMPRESSION: 1. Hepatic steatosis.
2. Negative gallbladder.

## 2019-12-13 ENCOUNTER — Encounter: Payer: Self-pay | Admitting: Internal Medicine

## 2019-12-13 MED ORDER — METFORMIN HCL 1000 MG PO TABS: 1000.0000 mg | ORAL_TABLET | Freq: Two times a day (BID) | ORAL | 0 refills | Status: DC

## 2019-12-13 MED ORDER — LOSARTAN POTASSIUM 50 MG PO TABS: 50.0000 mg | ORAL_TABLET | Freq: Every day | ORAL | 0 refills | Status: DC

## 2019-12-13 NOTE — Telephone Encounter (Signed)
Called and schedule an appt from Mychart message  Pt wanted to check on refills

## 2020-02-28 ENCOUNTER — Other Ambulatory Visit (HOSPITAL_COMMUNITY)
Admission: RE | Admit: 2020-02-28 | Discharge: 2020-02-28 | Disposition: A | Discharge status: Home / Self Care | Payer: 59 | Source: Ambulatory Visit | Attending: Internal Medicine | Admitting: Internal Medicine

## 2020-02-28 ENCOUNTER — Other Ambulatory Visit: Payer: Self-pay

## 2020-02-28 ENCOUNTER — Ambulatory Visit (INDEPENDENT_AMBULATORY_CARE_PROVIDER_SITE_OTHER): Payer: 59 | Admitting: Internal Medicine

## 2020-02-28 ENCOUNTER — Encounter: Payer: Self-pay | Admitting: Internal Medicine

## 2020-02-28 VITALS — BP 142/88 | HR 78 | Temp 98.0°F | Resp 16 | Ht 66.0 in | Wt 289.0 lb

## 2020-02-28 DIAGNOSIS — F439 Reaction to severe stress, unspecified: Secondary | ICD-10-CM | POA: Diagnosis not present

## 2020-02-28 DIAGNOSIS — I10 Essential (primary) hypertension: Secondary | ICD-10-CM | POA: Diagnosis not present

## 2020-02-28 DIAGNOSIS — Z124 Encounter for screening for malignant neoplasm of cervix: Secondary | ICD-10-CM | POA: Insufficient documentation

## 2020-02-28 DIAGNOSIS — Z0001 Encounter for general adult medical examination with abnormal findings: Secondary | ICD-10-CM

## 2020-02-28 DIAGNOSIS — K219 Gastro-esophageal reflux disease without esophagitis: Secondary | ICD-10-CM

## 2020-02-28 DIAGNOSIS — E7800 Pure hypercholesterolemia, unspecified: Secondary | ICD-10-CM | POA: Diagnosis not present

## 2020-02-28 DIAGNOSIS — Z1211 Encounter for screening for malignant neoplasm of colon: Secondary | ICD-10-CM | POA: Diagnosis not present

## 2020-02-28 DIAGNOSIS — E1165 Type 2 diabetes mellitus with hyperglycemia: Secondary | ICD-10-CM | POA: Diagnosis not present

## 2020-02-28 DIAGNOSIS — Z6841 Body Mass Index (BMI) 40.0 and over, adult: Secondary | ICD-10-CM

## 2020-02-28 DIAGNOSIS — Z0000 Encounter for general adult medical examination without abnormal findings: Secondary | ICD-10-CM

## 2020-02-28 DIAGNOSIS — S60311A Abrasion of right thumb, initial encounter: Secondary | ICD-10-CM

## 2020-02-28 MED ORDER — LOSARTAN POTASSIUM 100 MG PO TABS: 100.0000 mg | ORAL_TABLET | Freq: Every day | ORAL | 2 refills | Status: DC

## 2020-02-28 MED ORDER — MUPIROCIN 2 % EX OINT: TOPICAL_OINTMENT | CUTANEOUS | 0 refills | Status: AC

## 2020-02-28 NOTE — Progress Notes (Signed)
Patient ID: Wendy Callahan, female   DOB: [birth year], 59 y.o.   MRN: [id number]   Subjective:    Patient ID: Wendy Callahan, female    DOB: 1961-01-02, 59 y.o.   MRN: [id number]  HPI This visit occurred during the SARS-CoV-2 public health emergency.  Safety protocols were in place, including screening questions prior to the visit, additional usage of staff PPE, and extensive cleaning of exam room while observing appropriate contact time as indicated for disinfecting solutions.  Patient here for her physical exam.  She reports she is doing relatively well.  Working from home. Trying to stay active.  Watching diet.  Discussed diet and exercise.  No chest pain or sob reported.  No acid reflux.  No abdominal pain or cramping reported.  No bowel problems.  Did hit her right thumb - grader.  Some pain and minimal soft tissue swelling.  Blood pressures have been elevated - averaging 150-155/85-90.  Scheduled to get her covid booster Friday.  Wants to hold on flu vaccine.    Past Medical History:  Diagnosis Date  . Allergy   . Arthritis   . Environmental allergies   . GERD (gastroesophageal reflux disease)   . Hypercholesterolemia   . Hypertension   . Murmur   . Pre-diabetes   . Seizure ([place])    AS A CHILD   Past Surgical History:  Procedure Laterality Date  . BLADDER SURGERY  1973  . COLONOSCOPY  2014   Dr Vira Agar  . TUBAL LIGATION  10/1991  . UPPER GI ENDOSCOPY  2015   Dr Vira Agar  . VENTRAL HERNIA REPAIR N/A 07/18/2018   10 x 12 cm Bard soft mesh retrorectus space HERNIA REPAIR VENTRAL ADULT WITH COMPONENT SEPERATION;  Surgeon: Robert Bellow, MD;  Location: [facility] ORS;  Service: General;  Laterality: N/A;   Family History  Adopted: Yes  Problem Relation Age of Onset  . Congenital heart disease Son        Tetrology of fallot   Social History   Socioeconomic History  . Marital status: Married    Spouse name: Not on file  . Number of children: 2  . Years of education: Not on  file  . Highest education level: Not on file  Occupational History  . Occupation: Scientist, research (physical sciences): [employer]    Comment: [place]  Tobacco Use  . Smoking status: Former Smoker    Types: Cigarettes  . Smokeless tobacco: Never Used  Vaping Use  . Vaping Use: Never used  Substance and Sexual Activity  . Alcohol use: Yes    Alcohol/week: 0.0 standard drinks    Comment: occasional  . Drug use: No  . Sexual activity: Not on file  Other Topics Concern  . Not on file  Social History Narrative  . Not on file   Social Determinants of Health   Financial Resource Strain:   . Difficulty of Paying Living Expenses: Not on file  Food Insecurity:   . Worried About Charity fundraiser in the Last Year: Not on file  . Ran Out of Food in the Last Year: Not on file  Transportation Needs:   . Lack of Transportation (Medical): Not on file  . Lack of Transportation (Non-Medical): Not on file  Physical Activity:   . Days of Exercise per Week: Not on file  . Minutes of Exercise per Session: Not on file  Stress:   . Feeling of Stress : Not on  file  Social Connections:   . Frequency of Communication with Friends and Family: Not on file  . Frequency of Social Gatherings with Friends and Family: Not on file  . Attends Religious Services: Not on file  . Active Member of Clubs or Organizations: Not on file  . Attends Archivist Meetings: Not on file  . Marital Status: Not on file    Outpatient Encounter Medications as of 02/28/2020  Medication Sig  . Acetaminophen (TYLENOL ARTHRITIS PAIN PO) Take 1,300 mg by mouth 2 (two) times daily.   Marland Kitchen acyclovir ointment (ZOVIRAX) 5 % Apply 1 application topically daily as needed (fever blisters). Apply topical as directed  . fexofenadine (ALLEGRA) 180 MG tablet Take 180 mg by mouth daily.  Marland Kitchen losartan (COZAAR) 100 MG tablet Take 1 tablet (100 mg total) by mouth daily.  . metFORMIN (GLUCOPHAGE) 1000 MG tablet Take 1  tablet (1,000 mg total) by mouth 2 (two) times daily with a meal.  . mupirocin ointment (BACTROBAN) 2 % Apply to affected area bid  . omeprazole (PRILOSEC) 20 MG capsule Take 20 mg by mouth every morning.   . pimecrolimus (ELIDEL) 1 % cream Apply topically 2 (two) times daily as needed. (Patient taking differently: Apply 1 application topically 2 (two) times daily as needed (eczema). )  . [DISCONTINUED] losartan (COZAAR) 50 MG tablet Take 1 tablet (50 mg total) by mouth daily.   No facility-administered encounter medications on file as of 02/28/2020.    Review of Systems  Constitutional: Negative for appetite change and unexpected weight change.  HENT: Negative for congestion, sinus pressure and sore throat.   Eyes: Negative for pain and visual disturbance.  Respiratory: Negative for cough, chest tightness and shortness of breath.   Cardiovascular: Negative for chest pain, palpitations and leg swelling.  Gastrointestinal: Negative for abdominal pain, diarrhea, nausea and vomiting.  Genitourinary: Negative for difficulty urinating and dysuria.  Musculoskeletal: Negative for joint swelling and myalgias.  Skin: Negative for color change and rash.       Some minimal redness - adjacent to nail bed.   Neurological: Negative for dizziness, light-headedness and headaches.  Hematological: Negative for adenopathy. Does not bruise/bleed easily.  Psychiatric/Behavioral: Negative for agitation and dysphoric mood.       Objective:    Physical Exam Vitals reviewed.  Constitutional:      General: She is not in acute distress.    Appearance: Normal appearance. She is well-developed.  HENT:     Head: Normocephalic and atraumatic.     Right Ear: External ear normal.     Left Ear: External ear normal.  Eyes:     General: No scleral icterus.       Right eye: No discharge.        Left eye: No discharge.     Conjunctiva/sclera: Conjunctivae normal.  Neck:     Thyroid: No thyromegaly.    Cardiovascular:     Rate and Rhythm: Normal rate and regular rhythm.  Pulmonary:     Effort: No tachypnea, accessory muscle usage or respiratory distress.     Breath sounds: Normal breath sounds. No decreased breath sounds or wheezing.  Chest:     Breasts:        Right: No inverted nipple, mass, nipple discharge or tenderness (no axillary adenopathy).        Left: No inverted nipple, mass, nipple discharge or tenderness (no axilarry adenopathy).  Abdominal:     General: Bowel sounds are normal.  Palpations: Abdomen is soft.     Tenderness: There is no abdominal tenderness.  Genitourinary:    Comments: Normal external genitalia.  Vaginal vault without lesions.  Cervix identified.  Pap smear performed.  Could not appreciate any adnexal masses or tenderness.   Musculoskeletal:        General: No swelling or tenderness.     Cervical back: Neck supple. No tenderness.  Lymphadenopathy:     Cervical: No cervical adenopathy.  Skin:    Findings: No erythema or rash.     Comments: Minimal soft tissue swelling - adjacent to right thumb nail. Minimal tenderness to palpation.   Neurological:     Mental Status: She is alert and oriented to person, place, and time.  Psychiatric:        Mood and Affect: Mood normal.        Behavior: Behavior normal.     BP (!) 142/88   Pulse 78   Temp 98 F (36.7 C) (Oral)   Resp 16   Ht '5\' 6"'  (1.676 m)   Wt 289 lb (131.1 kg)   LMP 05/18/2016   SpO2 98%   BMI 46.65 kg/m  Wt Readings from Last 3 Encounters:  02/28/20 289 lb (131.1 kg)  09/13/18 272 lb (123.4 kg)  08/09/18 286 lb (129.7 kg)     Lab Results  Component Value Date   WBC 7.1 02/28/2020   HGB 14.5 02/28/2020   HCT 44.9 02/28/2020   PLT 216 02/28/2020   GLUCOSE 127 (H) 02/28/2020   CHOL 215 (H) 02/28/2020   TRIG 185 (H) 02/28/2020   HDL 69 02/28/2020   LDLCALC 114 (H) 02/28/2020   ALT 18 02/28/2020   AST 17 02/28/2020   NA 143 02/28/2020   K 4.9 02/28/2020   CL 102  02/28/2020   CREATININE 0.67 02/28/2020   BUN 12 02/28/2020   CO2 19 (L) 02/28/2020   TSH 1.580 02/28/2020   HGBA1C 6.0 (H) 03/17/2019       Assessment & Plan:   Problem List Items Addressed This Visit    Stress    Handling stress well.  Follow.       Hypercholesterolemia    Low cholesterol diet and exercise. Calculated cholesterol risk.  Wants to work on diet and exercise.  Follow lipid panel.       Relevant Medications   losartan (COZAAR) 100 MG tablet   Other Relevant Orders   Hepatic function panel (Completed)   Lipid panel (Completed)   TSH (Completed)   Health care maintenance    Physical today 02/28/20.  PAP 02/28/20.  Mammogram overdue.  Schedule.  Colonoscopy 07/2012.  Recommended f/u in 10 years. Hemoccult cards given.        GERD (gastroesophageal reflux disease)    No upper symptoms reported. On omeprazole.       Essential hypertension, benign    Blood pressure remaining elevated.  Increase losartan to 121m q day.  Follow pressures.  Follow metabolic panel.       Relevant Medications   losartan (COZAAR) 100 MG tablet   Other Relevant Orders   Basic metabolic panel (Completed)   CBC with Differential/Platelet   Diabetes mellitus ([place]    Low carb diet and exercise.  Follow met b and a1c. On metformin.       Relevant Medications   losartan (COZAAR) 100 MG tablet   BMI 45.0-49.9, adult (HCC)    Discussed diet and exercise.  Follow.  Abrasion of right thumb    bactroban as directed.  Follow.        Other Visit Diagnoses    Encounter for general adult medical examination with abnormal findings    -  Primary   Colon cancer screening       Relevant Orders   Fecal occult blood, imunochemical   Cervical cancer screening       Relevant Orders   Cytology - PAP( [place]) (Completed)       Einar Pheasant, MD

## 2020-02-28 NOTE — Assessment & Plan Note (Signed)
Physical today 02/28/20.  PAP 02/28/20.  Mammogram overdue.  Schedule.  Colonoscopy 07/2012.  Recommended f/u in 10 years. Hemoccult cards given.

## 2020-02-29 LAB — BASIC METABOLIC PANEL
BUN/Creatinine Ratio: 18 (ref 9–23)
BUN: 12 mg/dL (ref 6–24)
CO2: 19 mmol/L — ABNORMAL LOW (ref 20–29)
Calcium: 9.7 mg/dL (ref 8.7–10.2)
Chloride: 102 mmol/L (ref 96–106)
Creatinine, Ser: 0.67 mg/dL (ref 0.57–1.00)
GFR calc Af Amer: 113 mL/min/{1.73_m2} (ref >59)
GFR calc non Af Amer: 98 mL/min/{1.73_m2} (ref >59)
Glucose: 127 mg/dL — ABNORMAL HIGH (ref 65–99)
Potassium: 4.9 mmol/L (ref 3.5–5.2)
Sodium: 143 mmol/L (ref 134–144)

## 2020-02-29 LAB — LIPID PANEL
Chol/HDL Ratio: 3.1 ratio (ref 0.0–4.4)
Cholesterol, Total: 215 mg/dL — ABNORMAL HIGH (ref 100–199)
HDL: 69 mg/dL (ref >39)
LDL Chol Calc (NIH): 114 mg/dL — ABNORMAL HIGH (ref 0–99)
Triglycerides: 185 mg/dL — ABNORMAL HIGH (ref 0–149)
VLDL Cholesterol Cal: 32 mg/dL (ref 5–40)

## 2020-02-29 LAB — HEPATIC FUNCTION PANEL
ALT: 18 IU/L (ref 0–32)
AST: 17 IU/L (ref 0–40)
Albumin: 4.6 g/dL (ref 3.8–4.9)
Alkaline Phosphatase: 115 IU/L (ref 44–121)
Bilirubin Total: 0.3 mg/dL (ref 0.0–1.2)
Bilirubin, Direct: <0.1 mg/dL (ref 0.00–0.40)
Total Protein: 7.1 g/dL (ref 6.0–8.5)

## 2020-02-29 LAB — TSH: TSH: 1.58 u[IU]/mL (ref 0.450–4.500)

## 2020-03-01 LAB — CYTOLOGY - PAP
Comment: NEGATIVE
Diagnosis: NEGATIVE
High risk HPV: NEGATIVE

## 2020-03-03 ENCOUNTER — Encounter: Payer: Self-pay | Admitting: Internal Medicine

## 2020-03-03 DIAGNOSIS — S60311A Abrasion of right thumb, initial encounter: Secondary | ICD-10-CM | POA: Insufficient documentation

## 2020-03-03 NOTE — Assessment & Plan Note (Signed)
Handling stress well.  Follow.  

## 2020-03-03 NOTE — Assessment & Plan Note (Signed)
Low cholesterol diet and exercise. Calculated cholesterol risk.  Wants to work on diet and exercise.  Follow lipid panel.

## 2020-03-03 NOTE — Assessment & Plan Note (Signed)
No upper symptoms reported.  On omeprazole.  

## 2020-03-03 NOTE — Assessment & Plan Note (Signed)
Low carb diet and exercise.  Follow met b and a1c. On metformin.

## 2020-03-03 NOTE — Assessment & Plan Note (Signed)
bactroban as directed.  Follow  

## 2020-03-03 NOTE — Assessment & Plan Note (Signed)
Discussed diet and exercise.  Follow.  

## 2020-03-03 NOTE — Assessment & Plan Note (Signed)
Blood pressure remaining elevated.  Increase losartan to 100mg  q day.  Follow pressures.  Follow metabolic panel.

## 2020-03-22 LAB — CBC WITH DIFFERENTIAL/PLATELET
Basophils Absolute: 0 10*3/uL (ref 0.0–0.2)
Basos: 0 %
EOS (ABSOLUTE): 0.2 10*3/uL (ref 0.0–0.4)
Eos: 3 %
Hematocrit: 44.9 % (ref 34.0–46.6)
Hemoglobin: 14.5 g/dL (ref 11.1–15.9)
Immature Grans (Abs): 0 10*3/uL (ref 0.0–0.1)
Immature Granulocytes: 0 %
Lymphocytes Absolute: 1.7 10*3/uL (ref 0.7–3.1)
Lymphs: 23 %
MCH: 31.2 pg (ref 26.6–33.0)
MCHC: 32.3 g/dL (ref 31.5–35.7)
MCV: 97 fL (ref 79–97)
Monocytes Absolute: 0.5 10*3/uL (ref 0.1–0.9)
Monocytes: 7 %
Neutrophils Absolute: 4.7 10*3/uL (ref 1.4–7.0)
Neutrophils: 67 %
Platelets: 216 10*3/uL (ref 150–450)
RBC: 4.65 x10E6/uL (ref 3.77–5.28)
RDW: 13.7 % (ref 11.7–15.4)
WBC: 7.1 10*3/uL (ref 3.4–10.8)

## 2020-03-22 LAB — SPECIMEN STATUS REPORT

## 2020-03-28 ENCOUNTER — Ambulatory Visit: Payer: 59 | Admitting: Internal Medicine

## 2020-03-28 ENCOUNTER — Other Ambulatory Visit: Payer: Self-pay

## 2020-03-28 VITALS — BP 138/90 | HR 75 | Temp 97.8°F | Resp 16 | Ht 66.0 in | Wt 294.0 lb

## 2020-03-28 DIAGNOSIS — I10 Essential (primary) hypertension: Secondary | ICD-10-CM

## 2020-03-28 DIAGNOSIS — F439 Reaction to severe stress, unspecified: Secondary | ICD-10-CM

## 2020-03-28 DIAGNOSIS — E1165 Type 2 diabetes mellitus with hyperglycemia: Secondary | ICD-10-CM

## 2020-03-28 DIAGNOSIS — Z23 Encounter for immunization: Secondary | ICD-10-CM

## 2020-03-28 DIAGNOSIS — K219 Gastro-esophageal reflux disease without esophagitis: Secondary | ICD-10-CM

## 2020-03-28 MED ORDER — METFORMIN HCL 1000 MG PO TABS
1000.0000 mg | ORAL_TABLET | Freq: Two times a day (BID) | ORAL | 1 refills | Status: DC
Start: 2020-03-28 — End: 2020-09-23

## 2020-03-28 NOTE — Progress Notes (Signed)
Patient ID: Wendy Callahan, female   DOB: 01-16-1961, 59 y.o.   MRN: [id number]   Subjective:    Patient ID: Wendy Callahan, female    DOB: [birth year], 59 y.o.   MRN: [id number]  HPI This visit occurred during the SARS-CoV-2 public health emergency.  Safety protocols were in place, including screening questions prior to the visit, additional usage of staff PPE, and extensive cleaning of exam room while observing appropriate contact time as indicated for disinfecting solutions.  Patient here for a scheduled follow up.  Here to f/u regarding her blood pressure.  Recently increased losartan to 135m q day.  Blood pressure still a little elevated.  Tries to stay active.  No chest pain or sob reported.  No abdominal pain.  Some fullness intermittent.  Bowels moving.  Discussed due for mammogram.   Past Medical History:  Diagnosis Date  . Allergy   . Arthritis   . Environmental allergies   . GERD (gastroesophageal reflux disease)   . Hypercholesterolemia   . Hypertension   . Murmur   . Pre-diabetes   . Seizure ([place]    AS A CHILD   Past Surgical History:  Procedure Laterality Date  . BLADDER SURGERY  1973  . COLONOSCOPY  2014   Dr EVira Agar . TUBAL LIGATION  10/1991  . UPPER GI ENDOSCOPY  2015   Dr EVira Agar . VENTRAL HERNIA REPAIR N/A 07/18/2018   10 x 12 cm Bard soft mesh retrorectus space HERNIA REPAIR VENTRAL ADULT WITH COMPONENT SEPERATION;  Surgeon: BRobert Bellow MD;  Location: [facility] ORS;  Service: General;  Laterality: N/A;   Family History  Adopted: Yes  Problem Relation Age of Onset  . Congenital heart disease Son        Tetrology of fallot   Social History   Socioeconomic History  . Marital status: Married    Spouse name: Not on file  . Number of children: 2  . Years of education: Not on file  . Highest education level: Not on file  Occupational History  . 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 Smoking status: Former Smoker    Types: Cigarettes  . Smokeless tobacco: Never Used  Vaping Use  . Vaping Use: Never used  Substance and Sexual Activity  . Alcohol use: Yes    Alcohol/week: 0.0 standard drinks    Comment: occasional  . Drug use: No  . Sexual activity: Not on file  Other Topics Concern  . Not on file  Social History Narrative  . Not on file   Social Determinants of Health   Financial Resource Strain:   . Difficulty of Paying Living Expenses: Not on file  Food Insecurity:   . Worried About RCharity fundraiserin the Last Year: Not on file  . Ran Out of Food in the Last Year: Not on file  Transportation Needs:   . Lack of Transportation (Medical): Not on file  . Lack of Transportation (Non-Medical): Not on file  Physical Activity:   . Days of Exercise per Week: Not on file  . Minutes of Exercise per Session: Not on file  Stress:   . Feeling of Stress : Not on file  Social Connections:   . Frequency of Communication with Friends and Family: Not on file  . Frequency of Social Gatherings with Friends and Family: Not on file  . Attends Religious  Services: Not on file  . Active Member of Clubs or Organizations: Not on file  . Attends Archivist Meetings: Not on file  . Marital Status: Not on file    Outpatient Encounter Medications as of 03/28/2020  Medication Sig  . Acetaminophen (TYLENOL ARTHRITIS PAIN PO) Take 1,300 mg by mouth 2 (two) times daily.   Marland Kitchen acyclovir ointment (ZOVIRAX) 5 % Apply 1 application topically daily as needed (fever blisters). Apply topical as directed  . fexofenadine (ALLEGRA) 180 MG tablet Take 180 mg by mouth daily.  Marland Kitchen losartan (COZAAR) 100 MG tablet Take 1 tablet (100 mg total) by mouth daily.  . metFORMIN (GLUCOPHAGE) 1000 MG tablet Take 1 tablet (1,000 mg total) by mouth 2 (two) times daily with a meal.  . mupirocin ointment (BACTROBAN) 2 % Apply to affected area bid  . omeprazole (PRILOSEC) 20 MG capsule Take 20 mg  by mouth every morning.   . pimecrolimus (ELIDEL) 1 % cream Apply topically 2 (two) times daily as needed. (Patient taking differently: Apply 1 application topically 2 (two) times daily as needed (eczema). )  . [DISCONTINUED] metFORMIN (GLUCOPHAGE) 1000 MG tablet Take 1 tablet (1,000 mg total) by mouth 2 (two) times daily with a meal.   No facility-administered encounter medications on file as of 03/28/2020.    Review of Systems  Constitutional: Negative for appetite change and unexpected weight change.  HENT: Negative for congestion and sinus pressure.   Respiratory: Negative for cough, chest tightness and shortness of breath.   Cardiovascular: Negative for chest pain, palpitations and leg swelling.  Gastrointestinal: Negative for abdominal pain, diarrhea, nausea and vomiting.  Genitourinary: Negative for difficulty urinating and dysuria.  Musculoskeletal: Negative for joint swelling and myalgias.  Skin: Negative for color change and rash.  Neurological: Negative for dizziness, light-headedness and headaches.  Psychiatric/Behavioral: Negative for agitation and dysphoric mood.       Objective:    Physical Exam Vitals reviewed.  Constitutional:      General: She is not in acute distress.    Appearance: Normal appearance.  HENT:     Head: Normocephalic and atraumatic.     Right Ear: External ear normal.     Left Ear: External ear normal.  Eyes:     General: No scleral icterus.       Right eye: No discharge.        Left eye: No discharge.     Conjunctiva/sclera: Conjunctivae normal.  Neck:     Thyroid: No thyromegaly.  Cardiovascular:     Rate and Rhythm: Normal rate and regular rhythm.  Pulmonary:     Effort: No respiratory distress.     Breath sounds: Normal breath sounds. No wheezing.  Abdominal:     General: Bowel sounds are normal.     Palpations: Abdomen is soft.     Tenderness: There is no abdominal tenderness.  Musculoskeletal:        General: No swelling or  tenderness.     Cervical back: Neck supple. No tenderness.  Lymphadenopathy:     Cervical: No cervical adenopathy.  Skin:    Findings: No erythema or rash.  Neurological:     Mental Status: She is alert.  Psychiatric:        Mood and Affect: Mood normal.        Behavior: Behavior normal.     BP 138/90   Pulse 75   Temp 97.8 F (36.6 C) (Oral)   Resp 16   Ht 5'  6" (1.676 m)   Wt 294 lb (133.4 kg)   LMP 05/18/2016   SpO2 98%   BMI 47.45 kg/m  Wt Readings from Last 3 Encounters:  03/28/20 294 lb (133.4 kg)  02/28/20 289 lb (131.1 kg)  09/13/18 272 lb (123.4 kg)     Lab Results  Component Value Date   WBC 7.1 02/28/2020   HGB 14.5 02/28/2020   HCT 44.9 02/28/2020   PLT 216 02/28/2020   GLUCOSE 127 (H) 02/28/2020   CHOL 215 (H) 02/28/2020   TRIG 185 (H) 02/28/2020   HDL 69 02/28/2020   LDLCALC 114 (H) 02/28/2020   ALT 18 02/28/2020   AST 17 02/28/2020   NA 143 02/28/2020   K 4.9 02/28/2020   CL 102 02/28/2020   CREATININE 0.67 02/28/2020   BUN 12 02/28/2020   CO2 19 (L) 02/28/2020   TSH 1.580 02/28/2020   HGBA1C 6.0 (H) 03/17/2019       Assessment & Plan:   Problem List Items Addressed This Visit    Stress    Overall handling stress well.  Follow.        GERD (gastroesophageal reflux disease)    On omeprazole. No upper symptoms reported.        Essential hypertension, benign    Blood pressure as outlined.  Still elevated above goal. Discussed adding amlodipine to losartan.  She desires no further medication at this time.  Follow pressures.  Follow metabolic panel.       Relevant Orders   Basic metabolic panel   Diabetes mellitus ([place])    Low carb diet and exercise.  Follow met b and a1c. On metformin.       Relevant Medications   metFORMIN (GLUCOPHAGE) 1000 MG tablet   Other Relevant Orders   Hemoglobin A1c   Hepatic function panel   Lipid panel   Microalbumin / creatinine urine ratio    Other Visit Diagnoses    Need for immunization  against influenza    -  Primary   Relevant Orders   Flu Vaccine QUAD 36+ mos IM (Completed)       Einar Pheasant, MD

## 2020-03-31 ENCOUNTER — Encounter: Payer: Self-pay | Admitting: Internal Medicine

## 2020-03-31 NOTE — Assessment & Plan Note (Signed)
Low carb diet and exercise.  Follow met b and a1c. On metformin.  

## 2020-03-31 NOTE — Assessment & Plan Note (Signed)
Overall handling stress well.  Follow.  

## 2020-03-31 NOTE — Assessment & Plan Note (Signed)
On omeprazole.  No upper symptoms reported.   

## 2020-03-31 NOTE — Assessment & Plan Note (Signed)
Blood pressure as outlined.  Still elevated above goal. Discussed adding amlodipine to losartan.  She desires no further medication at this time.  Follow pressures.  Follow metabolic panel.

## 2020-04-01 ENCOUNTER — Telehealth: Payer: Self-pay | Admitting: Internal Medicine

## 2020-04-01 NOTE — Telephone Encounter (Signed)
Needs mammogram scheduled - prefers 3-4 pm.

## 2020-04-05 NOTE — Telephone Encounter (Signed)
Patient scheduled.

## 2020-05-29 ENCOUNTER — Ambulatory Visit
Admission: RE | Admit: 2020-05-29 | Discharge: 2020-05-29 | Disposition: A | Discharge status: Home / Self Care | Payer: 59 | Source: Ambulatory Visit | Attending: Internal Medicine | Admitting: Internal Medicine

## 2020-05-29 ENCOUNTER — Other Ambulatory Visit: Payer: Self-pay

## 2020-05-29 DIAGNOSIS — Z1231 Encounter for screening mammogram for malignant neoplasm of breast: Secondary | ICD-10-CM | POA: Insufficient documentation

## 2020-06-18 ENCOUNTER — Encounter: Payer: Self-pay | Admitting: Internal Medicine

## 2020-06-19 ENCOUNTER — Other Ambulatory Visit: Payer: Self-pay

## 2020-06-19 MED ORDER — LOSARTAN POTASSIUM 100 MG PO TABS
100.0000 mg | ORAL_TABLET | Freq: Every day | ORAL | 2 refills | Status: DC
Start: 2020-06-19 — End: 2020-09-23

## 2020-07-03 ENCOUNTER — Encounter: Payer: Self-pay | Admitting: Internal Medicine

## 2020-07-04 NOTE — Telephone Encounter (Signed)
Already rescheduled

## 2020-07-05 ENCOUNTER — Other Ambulatory Visit: Payer: 59

## 2020-07-05 ENCOUNTER — Ambulatory Visit: Payer: 59 | Admitting: Internal Medicine

## 2020-09-10 ENCOUNTER — Ambulatory Visit: Payer: 59 | Admitting: Internal Medicine

## 2020-09-23 ENCOUNTER — Encounter: Payer: Self-pay | Admitting: Internal Medicine

## 2020-09-23 ENCOUNTER — Other Ambulatory Visit: Payer: Self-pay

## 2020-09-23 MED ORDER — LOSARTAN POTASSIUM 100 MG PO TABS
100.0000 mg | ORAL_TABLET | Freq: Every day | ORAL | 2 refills | Status: DC
Start: 2020-09-23 — End: 2020-12-30

## 2020-09-23 MED ORDER — METFORMIN HCL 1000 MG PO TABS
1000.0000 mg | ORAL_TABLET | Freq: Two times a day (BID) | ORAL | 1 refills | Status: DC
Start: 2020-09-23 — End: 2021-03-26

## 2020-09-30 LAB — HM DIABETES EYE EXAM

## 2020-11-11 ENCOUNTER — Encounter: Payer: Self-pay | Admitting: Internal Medicine

## 2020-11-11 ENCOUNTER — Other Ambulatory Visit: Payer: Self-pay

## 2020-11-11 ENCOUNTER — Ambulatory Visit: Payer: 59 | Admitting: Internal Medicine

## 2020-11-11 DIAGNOSIS — K219 Gastro-esophageal reflux disease without esophagitis: Secondary | ICD-10-CM

## 2020-11-11 DIAGNOSIS — R059 Cough, unspecified: Secondary | ICD-10-CM

## 2020-11-11 DIAGNOSIS — E7800 Pure hypercholesterolemia, unspecified: Secondary | ICD-10-CM | POA: Diagnosis not present

## 2020-11-11 DIAGNOSIS — Z6841 Body Mass Index (BMI) 40.0 and over, adult: Secondary | ICD-10-CM

## 2020-11-11 DIAGNOSIS — I10 Essential (primary) hypertension: Secondary | ICD-10-CM

## 2020-11-11 DIAGNOSIS — E1165 Type 2 diabetes mellitus with hyperglycemia: Secondary | ICD-10-CM | POA: Diagnosis not present

## 2020-11-11 LAB — HM DIABETES FOOT EXAM

## 2020-11-11 MED ORDER — NYSTATIN 100000 UNIT/ML MT SUSP: 5.0000 mL | Freq: Three times a day (TID) | OROMUCOSAL | 0 refills | Status: DC | PRN

## 2020-11-11 NOTE — Progress Notes (Signed)
Patient ID: Wendy Callahan, female   DOB: 11/03/1960, 60 y.o.   MRN: [id number]   Subjective:    Patient ID: Wendy Callahan, female    DOB: [birth year], 60 y.o.   MRN: [id number]  HPI This visit occurred during the SARS-CoV-2 public health emergency.  Safety protocols were in place, including screening questions prior to the visit, additional usage of staff PPE, and extensive cleaning of exam room while observing appropriate contact time as indicated for disinfecting solutions.   Patient here for a scheduled follow up.  Here to follow up regarding her blood pressure, blood sugar and cholesterol.  She reports she is doing relatively well.  She does report starting second week of May she developed a cough with runny nose.  Had a pretty significant cough with what sounds like coughing fits.  She states the cough is better.  Still has an occasional dry cough which she relates to possibly some increased drainage.  She also reports that her throat still feels a little raw. No chest pain reported.  No chest tightness or shortness of breath.  Breathing stable.  Overall feels this significantly improved.  She is using Flonase.  Taking Allegra regularly.  No increased abdominal pain reported.  No bowel issues reported.  No fever.  No nausea or vomiting.  She reports she is staying physically active.  Exercising.  Discussed diet and exercise.  Has not been checking outside blood pressure.  Not checking sugars.  Past Medical History:  Diagnosis Date   Allergy    Arthritis    Environmental allergies    GERD (gastroesophageal reflux disease)    Hypercholesterolemia    Hypertension    Murmur    Pre-diabetes    Seizure ([place])    AS A CHILD   Past Surgical History:  Procedure Laterality Date   BLADDER SURGERY  1973   COLONOSCOPY  2014   Dr Vira Agar   TUBAL LIGATION  10/1991   UPPER GI ENDOSCOPY  2015   Dr Vira Agar   VENTRAL HERNIA REPAIR N/A 07/18/2018   10 x 12 cm Bard soft mesh retrorectus space HERNIA  REPAIR VENTRAL ADULT WITH COMPONENT SEPERATION;  Surgeon: Robert Bellow, MD;  Location: [facility] ORS;  Service: General;  Laterality: N/A;   Family History  Adopted: Yes  Problem Relation Age of Onset   Congenital heart disease Son        Tetrology of fallot   Social History   Socioeconomic History   Marital status: Married    Spouse name: Not on file   Number of children: 2   Years of education: Not on file   Highest education level: Not on file  Occupational History   Occupation: PROJECT ANALYST    Employer: [employer]    Comment: [place]  Tobacco Use   Smoking status: Former    Pack years: 0.00    Types: Cigarettes   Smokeless tobacco: Never  Vaping Use   Vaping Use: Never used  Substance and Sexual Activity   Alcohol use: Yes    Alcohol/week: 0.0 standard drinks    Comment: occasional   Drug use: No   Sexual activity: Not on file  Other Topics Concern   Not on file  Social History Narrative   Not on file   Social Determinants of Health   Financial Resource Strain: Not on file  Food Insecurity: Not on file  Transportation Needs: Not on file  Physical Activity: Not on file  Stress:  Not on file  Social Connections: Not on file    Outpatient Encounter Medications as of 11/11/2020  Medication Sig   nystatin (MYCOSTATIN) 100000 UNIT/ML suspension Take 5 mLs (500,000 Units total) by mouth 3 (three) times daily as needed. Swish and spit tid   Acetaminophen (TYLENOL ARTHRITIS PAIN PO) Take 1,300 mg by mouth 2 (two) times daily.    acyclovir ointment (ZOVIRAX) 5 % Apply 1 application topically daily as needed (fever blisters). Apply topical as directed   fexofenadine (ALLEGRA) 180 MG tablet Take 180 mg by mouth daily.   losartan (COZAAR) 100 MG tablet Take 1 tablet (100 mg total) by mouth daily.   metFORMIN (GLUCOPHAGE) 1000 MG tablet Take 1 tablet (1,000 mg total) by mouth 2 (two) times daily with a meal.   mupirocin ointment (BACTROBAN) 2 % Apply  to affected area bid   omeprazole (PRILOSEC) 20 MG capsule Take 20 mg by mouth every morning.    pimecrolimus (ELIDEL) 1 % cream Apply topically 2 (two) times daily as needed. (Patient taking differently: Apply 1 application topically 2 (two) times daily as needed (eczema). )   No facility-administered encounter medications on file as of 11/11/2020.     Review of Systems  Constitutional:  Negative for appetite change and unexpected weight change.  HENT:  Positive for postnasal drip. Negative for sinus pressure.        No increased sinus congestion.  Does report some drainage.  Throat feels raw - left posterior.    Respiratory:  Positive for cough. Negative for chest tightness, shortness of breath and wheezing.        Intermittent dry cough.    Cardiovascular:  Negative for chest pain, palpitations and leg swelling.  Gastrointestinal:  Negative for abdominal pain, diarrhea, nausea and vomiting.  Genitourinary:  Negative for difficulty urinating and dysuria.  Musculoskeletal:  Negative for joint swelling and myalgias.  Skin:  Negative for color change and rash.  Neurological:  Negative for dizziness, light-headedness and headaches.  Hematological:  Negative for adenopathy.  Psychiatric/Behavioral:  Negative for agitation and dysphoric mood.       Objective:    Physical Exam Vitals reviewed.  Constitutional:      General: She is not in acute distress.    Appearance: Normal appearance.  HENT:     Head: Normocephalic and atraumatic.     Right Ear: Ear canal and external ear normal.     Left Ear: Tympanic membrane, ear canal and external ear normal.     Nose: Nose normal.     Mouth/Throat:     Pharynx: Oropharynx is clear. No oropharyngeal exudate or posterior oropharyngeal erythema.  Eyes:     General: No scleral icterus.       Right eye: No discharge.        Left eye: No discharge.     Conjunctiva/sclera: Conjunctivae normal.  Neck:     Thyroid: No thyromegaly.  Cardiovascular:      Rate and Rhythm: Normal rate and regular rhythm.  Pulmonary:     Effort: No respiratory distress.     Breath sounds: Normal breath sounds. No wheezing.  Abdominal:     General: Bowel sounds are normal.     Palpations: Abdomen is soft.     Tenderness: There is no abdominal tenderness.  Musculoskeletal:        General: No swelling or tenderness.     Cervical back: Neck supple. No tenderness.  Lymphadenopathy:     Cervical: No cervical adenopathy.  Skin:    Findings: No erythema or rash.  Neurological:     Mental Status: She is alert.  Psychiatric:        Mood and Affect: Mood normal.        Behavior: Behavior normal.    BP 130/70   Pulse 71   Temp 97.9 F (36.6 C)   Resp 16   Ht 5\' 6"  (1.676 m)   Wt (!) 300 lb 12.8 oz (136.4 kg)   LMP 05/18/2016   SpO2 98%   BMI 48.55 kg/m  Wt Readings from Last 3 Encounters:  11/11/20 (!) 300 lb 12.8 oz (136.4 kg)  03/28/20 294 lb (133.4 kg)  02/28/20 289 lb (131.1 kg)    Outpatient Encounter Medications as of 11/11/2020  Medication Sig   nystatin (MYCOSTATIN) 100000 UNIT/ML suspension Take 5 mLs (500,000 Units total) by mouth 3 (three) times daily as needed. Swish and spit tid   Acetaminophen (TYLENOL ARTHRITIS PAIN PO) Take 1,300 mg by mouth 2 (two) times daily.    acyclovir ointment (ZOVIRAX) 5 % Apply 1 application topically daily as needed (fever blisters). Apply topical as directed   fexofenadine (ALLEGRA) 180 MG tablet Take 180 mg by mouth daily.   losartan (COZAAR) 100 MG tablet Take 1 tablet (100 mg total) by mouth daily.   metFORMIN (GLUCOPHAGE) 1000 MG tablet Take 1 tablet (1,000 mg total) by mouth 2 (two) times daily with a meal.   mupirocin ointment (BACTROBAN) 2 % Apply to affected area bid   omeprazole (PRILOSEC) 20 MG capsule Take 20 mg by mouth every morning.    pimecrolimus (ELIDEL) 1 % cream Apply topically 2 (two) times daily as needed. (Patient taking differently: Apply 1 application topically 2 (two) times  daily as needed (eczema). )   No facility-administered encounter medications on file as of 11/11/2020.     Lab Results  Component Value Date   WBC 7.1 02/28/2020   HGB 14.5 02/28/2020   HCT 44.9 02/28/2020   PLT 216 02/28/2020   GLUCOSE 127 (H) 02/28/2020   CHOL 215 (H) 02/28/2020   TRIG 185 (H) 02/28/2020   HDL 69 02/28/2020   LDLCALC 114 (H) 02/28/2020   ALT 18 02/28/2020   AST 17 02/28/2020   NA 143 02/28/2020   K 4.9 02/28/2020   CL 102 02/28/2020   CREATININE 0.67 02/28/2020   BUN 12 02/28/2020   CO2 19 (L) 02/28/2020   TSH 1.580 02/28/2020   HGBA1C 6.0 (H) 03/17/2019    MM 3D SCREEN BREAST BILATERAL  Result Date: 05/31/2020 CLINICAL DATA:  Screening. EXAM: DIGITAL SCREENING BILATERAL MAMMOGRAM WITH TOMO AND CAD COMPARISON:  Previous exam(s). ACR Breast Density Category b: There are scattered areas of fibroglandular density. FINDINGS: There are no findings suspicious for malignancy. Images were processed with CAD. IMPRESSION: No mammographic evidence of malignancy. A result letter of this screening mammogram will be mailed directly to the patient. RECOMMENDATION: Screening mammogram in one year. (Code:SM-B-01Y) BI-RADS CATEGORY  1: Negative. Electronically Signed   By: Lajean Manes M.D.   On: 05/31/2020 08:02       Assessment & Plan:   Problem List Items Addressed This Visit     BMI 45.0-49.9, adult ([place])    Discussed diet and exercise.  Follow.        Cough    Recent increased cough and congestion.  Much improved.  Lungs are clear.  Some occasional dry cough.  Continue flonase and allegra.  With the throat irritation, nystatin  suspension as directed.  Follow.  Call if cough does not completely resolve.  Wanted to hold on cxr.  Follow.        Diabetes mellitus ([place])    Continues on metformin.  Not checking sugars.  Discussed diet and exercise.  Check magnesium Pozzi today.  Also check urine microalbumin to creatinine ratio.       Essential hypertension,  benign    Blood pressure on recheck by me 138/92.  Discussed again regarding elevated blood pressure.  Discussed goal blood pressure ideally <130/80.  Discussed addition of medication to losartan.  She prefers to try to watch her diet and lose weight and follow pressure.  Wants to hold on starting another medication.  Continue losartan.  Check metabolic panel.        GERD (gastroesophageal reflux disease)    No acid reflux symptoms.  Omeprazole.         Hypercholesterolemia    Low-cholesterol diet and exercise.  Have discussed calculated cholesterol results.  She has 1 to work on diet and exercise.  Want to hold on medication.  Follow-up.  Check today.         Einar Pheasant, MD

## 2020-11-11 NOTE — Addendum Note (Signed)
Addended by: Ezequiel Ganser on: 11/11/2020 08:43 AM   Modules accepted: Orders

## 2020-11-11 NOTE — Assessment & Plan Note (Signed)
No acid reflux symptoms.  Omeprazole.

## 2020-11-11 NOTE — Assessment & Plan Note (Signed)
Recent increased cough and congestion.  Much improved.  Lungs are clear.  Some occasional dry cough.  Continue flonase and allegra.  With the throat irritation, nystatin suspension as directed.  Follow.  Call if cough does not completely resolve.  Wanted to hold on cxr.  Follow.

## 2020-11-11 NOTE — Assessment & Plan Note (Signed)
Blood pressure on recheck by me 138/92.  Discussed again regarding elevated blood pressure.  Discussed goal blood pressure ideally <130/80.  Discussed addition of medication to losartan.  She prefers to try to watch her diet and lose weight and follow pressure.  Wants to hold on starting another medication.  Continue losartan.  Check metabolic panel.

## 2020-11-11 NOTE — Assessment & Plan Note (Signed)
Continues on metformin.  Not checking sugars.  Discussed diet and exercise.  Check magnesium Pozzi today.  Also check urine microalbumin to creatinine ratio.

## 2020-11-11 NOTE — Assessment & Plan Note (Signed)
Discussed diet and exercise.  Follow.  

## 2020-11-11 NOTE — Assessment & Plan Note (Signed)
Low-cholesterol diet and exercise.  Have discussed calculated cholesterol results.  She has 1 to work on diet and exercise.  Want to hold on medication.  Follow-up.  Check today.

## 2020-11-12 LAB — CBC WITH DIFFERENTIAL/PLATELET
Basophils Absolute: 0 10*3/uL (ref 0.0–0.2)
Basos: 1 %
EOS (ABSOLUTE): 0.3 10*3/uL (ref 0.0–0.4)
Eos: 4 %
Hematocrit: 39.1 % (ref 34.0–46.6)
Hemoglobin: 13.4 g/dL (ref 11.1–15.9)
Immature Grans (Abs): 0 10*3/uL (ref 0.0–0.1)
Immature Granulocytes: 0 %
Lymphocytes Absolute: 1.7 10*3/uL (ref 0.7–3.1)
Lymphs: 27 %
MCH: 31.4 pg (ref 26.6–33.0)
MCHC: 34.3 g/dL (ref 31.5–35.7)
MCV: 92 fL (ref 79–97)
Monocytes Absolute: 0.5 10*3/uL (ref 0.1–0.9)
Monocytes: 7 %
Neutrophils Absolute: 3.8 10*3/uL (ref 1.4–7.0)
Neutrophils: 61 %
Platelets: 202 10*3/uL (ref 150–450)
RBC: 4.27 x10E6/uL (ref 3.77–5.28)
RDW: 13.1 % (ref 11.7–15.4)
WBC: 6.3 10*3/uL (ref 3.4–10.8)

## 2020-11-12 LAB — BASIC METABOLIC PANEL WITH GFR
BUN/Creatinine Ratio: 22 (ref 9–23)
BUN: 12 mg/dL (ref 6–24)
CO2: 23 mmol/L (ref 20–29)
Calcium: 9.3 mg/dL (ref 8.7–10.2)
Chloride: 106 mmol/L (ref 96–106)
Creatinine, Ser: 0.54 mg/dL — ABNORMAL LOW (ref 0.57–1.00)
Glucose: 145 mg/dL — ABNORMAL HIGH (ref 65–99)
Potassium: 5.2 mmol/L (ref 3.5–5.2)
Sodium: 143 mmol/L (ref 134–144)
eGFR: 107 mL/min/1.73 (ref >59)

## 2020-11-12 LAB — HEMOGLOBIN A1C
Est. average glucose Bld gHb Est-mCnc: 128 mg/dL
Hgb A1c MFr Bld: 6.1 % — ABNORMAL HIGH (ref 4.8–5.6)

## 2020-11-12 LAB — HEPATIC FUNCTION PANEL
ALT: 18 IU/L (ref 0–32)
AST: 16 IU/L (ref 0–40)
Albumin: 4.5 g/dL (ref 3.8–4.9)
Alkaline Phosphatase: 114 IU/L (ref 44–121)
Bilirubin Total: 0.2 mg/dL (ref 0.0–1.2)
Bilirubin, Direct: <0.1 mg/dL (ref 0.00–0.40)
Total Protein: 6.9 g/dL (ref 6.0–8.5)

## 2020-11-12 LAB — MICROALBUMIN / CREATININE URINE RATIO
Creatinine, Urine: 121.8 mg/dL
Microalb/Creat Ratio: 51 mg/g creat — ABNORMAL HIGH (ref 0–29)
Microalbumin, Urine: 62.3 ug/mL

## 2020-11-12 LAB — LIPID PANEL
Chol/HDL Ratio: 3.5 ratio (ref 0.0–4.4)
Cholesterol, Total: 229 mg/dL — ABNORMAL HIGH (ref 100–199)
HDL: 66 mg/dL (ref >39)
LDL Chol Calc (NIH): 114 mg/dL — ABNORMAL HIGH (ref 0–99)
Triglycerides: 289 mg/dL — ABNORMAL HIGH (ref 0–149)
VLDL Cholesterol Cal: 49 mg/dL — ABNORMAL HIGH (ref 5–40)

## 2020-12-18 ENCOUNTER — Encounter: Payer: Self-pay | Admitting: Internal Medicine

## 2020-12-18 NOTE — Telephone Encounter (Signed)
Please call and f/u and confirm pt seen and doing ok.  See previous note.

## 2020-12-18 NOTE — Telephone Encounter (Signed)
Placed call to pt. Pt does not want to wait til Friday to be seen so she will see if she can be seen somewhere else if not she'll let us know.

## 2020-12-18 NOTE — Telephone Encounter (Signed)
Given persistent symptoms, needs to be evaluated.  I can work in for virtual visit - Friday (either at 7:00 or at 4:00).  If problem, let me know

## 2020-12-23 NOTE — Telephone Encounter (Signed)
LMTCB

## 2020-12-23 NOTE — Telephone Encounter (Signed)
Pt states she did an MD live through [place] and given antibiotic cefdinir and albuterol inhaler . Pt states she is feeling a bit better. Pt will call office if symptoms continue.

## 2020-12-29 ENCOUNTER — Other Ambulatory Visit: Payer: Self-pay | Admitting: Internal Medicine

## 2020-12-30 ENCOUNTER — Encounter: Payer: Self-pay | Admitting: Internal Medicine

## 2020-12-30 ENCOUNTER — Other Ambulatory Visit: Payer: Self-pay

## 2020-12-30 MED ORDER — LOSARTAN POTASSIUM 100 MG PO TABS: 100.0000 mg | ORAL_TABLET | Freq: Every day | ORAL | 1 refills | Status: DC

## 2021-03-24 ENCOUNTER — Encounter: Payer: 59 | Admitting: Internal Medicine

## 2021-03-26 ENCOUNTER — Encounter: Payer: Self-pay | Admitting: Internal Medicine

## 2021-03-26 MED ORDER — METFORMIN HCL 1000 MG PO TABS: 1000.0000 mg | ORAL_TABLET | Freq: Two times a day (BID) | ORAL | 1 refills | Status: DC

## 2021-06-19 ENCOUNTER — Other Ambulatory Visit: Payer: Self-pay

## 2021-06-19 ENCOUNTER — Encounter: Payer: Self-pay | Admitting: *Deleted

## 2021-06-19 ENCOUNTER — Ambulatory Visit (INDEPENDENT_AMBULATORY_CARE_PROVIDER_SITE_OTHER): Payer: 59 | Admitting: Internal Medicine

## 2021-06-19 ENCOUNTER — Encounter: Payer: Self-pay | Admitting: Internal Medicine

## 2021-06-19 VITALS — BP 126/72 | HR 70 | Temp 97.9°F | Resp 16 | Ht 66.0 in | Wt 298.0 lb

## 2021-06-19 DIAGNOSIS — Z23 Encounter for immunization: Secondary | ICD-10-CM

## 2021-06-19 DIAGNOSIS — F439 Reaction to severe stress, unspecified: Secondary | ICD-10-CM

## 2021-06-19 DIAGNOSIS — E1165 Type 2 diabetes mellitus with hyperglycemia: Secondary | ICD-10-CM | POA: Diagnosis not present

## 2021-06-19 DIAGNOSIS — Z0000 Encounter for general adult medical examination without abnormal findings: Secondary | ICD-10-CM

## 2021-06-19 DIAGNOSIS — Z1211 Encounter for screening for malignant neoplasm of colon: Secondary | ICD-10-CM

## 2021-06-19 DIAGNOSIS — E7800 Pure hypercholesterolemia, unspecified: Secondary | ICD-10-CM | POA: Diagnosis not present

## 2021-06-19 DIAGNOSIS — Z1231 Encounter for screening mammogram for malignant neoplasm of breast: Secondary | ICD-10-CM

## 2021-06-19 DIAGNOSIS — I10 Essential (primary) hypertension: Secondary | ICD-10-CM

## 2021-06-19 DIAGNOSIS — Z6841 Body Mass Index (BMI) 40.0 and over, adult: Secondary | ICD-10-CM

## 2021-06-19 DIAGNOSIS — K219 Gastro-esophageal reflux disease without esophagitis: Secondary | ICD-10-CM

## 2021-06-19 MED ORDER — METFORMIN HCL 1000 MG PO TABS: 1000.0000 mg | ORAL_TABLET | Freq: Two times a day (BID) | ORAL | 1 refills | Status: DC

## 2021-06-19 MED ORDER — ACYCLOVIR 5 % EX OINT: 1.0000 "application " | TOPICAL_OINTMENT | Freq: Every day | CUTANEOUS | 0 refills | Status: DC | PRN

## 2021-06-19 MED ORDER — PIMECROLIMUS 1 % EX CREA: TOPICAL_CREAM | Freq: Two times a day (BID) | CUTANEOUS | 0 refills | Status: DC | PRN

## 2021-06-19 MED ORDER — LOSARTAN POTASSIUM 100 MG PO TABS: 100.0000 mg | ORAL_TABLET | Freq: Every day | ORAL | 1 refills | Status: DC

## 2021-06-19 MED ORDER — CLOTRIMAZOLE-BETAMETHASONE 1-0.05 % EX CREA: 1.0000 "application " | TOPICAL_CREAM | Freq: Two times a day (BID) | CUTANEOUS | 0 refills | Status: AC | PRN

## 2021-06-19 NOTE — Patient Instructions (Signed)
Amlodipine - blood pressure medication.

## 2021-06-19 NOTE — Assessment & Plan Note (Addendum)
Physical today 06/19/21.  PAP 02/28/20 negative with negative HPV.  Colonoscopy 07/2012.  Recommended f/u in 10 years.  Hemoccult cards given.

## 2021-06-19 NOTE — Progress Notes (Signed)
Patient ID: Wendy Callahan, female   DOB: 1961-03-16, 61 y.o.   MRN: [id number]   Subjective:    Patient ID: Wendy Callahan, female    DOB: 1960/10/06, 61 y.o.   MRN: [id number]  This visit occurred during the SARS-CoV-2 public health emergency.  Safety protocols were in place, including screening questions prior to the visit, additional usage of staff PPE, and extensive cleaning of exam room while observing appropriate contact time as indicated for disinfecting solutions.   Patient here for her physical.   Chief Complaint  Patient presents with   Annual Exam   .   HPI Reports she is doing well.  Staying busy.  No chest pain or sob.  No acid reflux or abdominal pain reported.  Had covid Christmas.  No residual problems.  No cough or congestion.  No abdominal pain.  Bowels moving.  Reports she is watching her diet and staying active.     Past Medical History:  Diagnosis Date   Allergy    Arthritis    Environmental allergies    GERD (gastroesophageal reflux disease)    Hypercholesterolemia    Hypertension    Murmur    Pre-diabetes    Seizure ([place])    AS A CHILD   Past Surgical History:  Procedure Laterality Date   BLADDER SURGERY  1973   COLONOSCOPY  2014   Dr Vira Agar   TUBAL LIGATION  10/1991   UPPER GI ENDOSCOPY  2015   Dr Vira Agar   VENTRAL HERNIA REPAIR N/A 07/18/2018   10 x 12 cm Bard soft mesh retrorectus space HERNIA REPAIR VENTRAL ADULT WITH COMPONENT SEPERATION;  Surgeon: Robert Bellow, MD;  Location: [facility] ORS;  Service: General;  Laterality: N/A;   Family History  Adopted: Yes  Problem Relation Age of Onset   Congenital heart disease Son        Tetrology of fallot   Social History   Socioeconomic History   Marital status: Married    Spouse name: Not on file   Number of children: 2   Years of education: Not on file   Highest education level: Not on file  Occupational History   Occupation: PROJECT ANALYST    Employer: [employer]    Comment: [employer]  Tobacco Use   Smoking status: Former    Types: Cigarettes   Smokeless tobacco: Never  Vaping Use   Vaping Use: Never used  Substance and Sexual Activity   Alcohol use: Yes    Alcohol/week: 0.0 standard drinks    Comment: occasional   Drug use: No   Sexual activity: Not on file  Other Topics Concern   Not on file  Social History Narrative   Not on file   Social Determinants of Health   Financial Resource Strain: Not on file  Food Insecurity: Not on file  Transportation Needs: Not on file  Physical Activity: Not on file  Stress: Not on file  Social Connections: Not on file     Review of Systems  Constitutional:  Negative for appetite change and unexpected weight change.  HENT:  Negative for congestion, sinus pressure and sore throat.   Eyes:  Negative for pain and visual disturbance.  Respiratory:  Negative for cough, chest tightness and shortness of breath.   Cardiovascular:  Negative for chest pain, palpitations and leg swelling.  Gastrointestinal:  Negative for abdominal pain, diarrhea, nausea and vomiting.  Genitourinary:  Negative for difficulty urinating and dysuria.  Musculoskeletal:  Negative for joint swelling and myalgias.  Skin:  Negative for color change and rash.  Neurological:  Negative for dizziness, light-headedness and headaches.  Hematological:  Negative for adenopathy. Does not bruise/bleed easily.  Psychiatric/Behavioral:  Negative for agitation and dysphoric mood.       Objective:     BP 126/72    Pulse 70    Temp 97.9 F (36.6 C)    Resp 16    Ht '5\' 6"'  (1.676 m)    Wt 298 lb (135.2 kg)    LMP 05/18/2016    SpO2 98%    BMI 48.10 kg/m  Wt Readings from Last 3 Encounters:  06/19/21 298 lb (135.2 kg)  11/11/20 (!) 300 lb 12.8 oz (136.4 kg)  03/28/20 294 lb (133.4 kg)    Physical Exam Vitals reviewed.  Constitutional:      General: She is not in acute distress.    Appearance: Normal appearance. She is well-developed.   HENT:     Head: Normocephalic and atraumatic.     Right Ear: External ear normal.     Left Ear: External ear normal.  Eyes:     General: No scleral icterus.       Right eye: No discharge.        Left eye: No discharge.     Conjunctiva/sclera: Conjunctivae normal.  Neck:     Thyroid: No thyromegaly.  Cardiovascular:     Rate and Rhythm: Normal rate and regular rhythm.  Pulmonary:     Effort: No tachypnea, accessory muscle usage or respiratory distress.     Breath sounds: Normal breath sounds. No decreased breath sounds or wheezing.  Chest:  Breasts:    Right: No inverted nipple, mass, nipple discharge or tenderness (no axillary adenopathy).     Left: No inverted nipple, mass, nipple discharge or tenderness (no axilarry adenopathy).  Abdominal:     General: Bowel sounds are normal.     Palpations: Abdomen is soft.     Tenderness: There is no abdominal tenderness.  Musculoskeletal:        General: No swelling or tenderness.     Cervical back: Neck supple.  Lymphadenopathy:     Cervical: No cervical adenopathy.  Skin:    Findings: No erythema or rash.  Neurological:     Mental Status: She is alert and oriented to person, place, and time.  Psychiatric:        Mood and Affect: Mood normal.        Behavior: Behavior normal.     Outpatient Encounter Medications as of 06/19/2021  Medication Sig   [DISCONTINUED] clotrimazole-betamethasone (LOTRISONE) cream Apply 1 application topically 2 (two) times daily. 7-10 days   Acetaminophen (TYLENOL ARTHRITIS PAIN PO) Take 1,300 mg by mouth 2 (two) times daily.    acyclovir ointment (ZOVIRAX) 5 % Apply 1 application topically daily as needed (fever blisters). Apply topical as directed   clotrimazole-betamethasone (LOTRISONE) cream Apply 1 application topically 2 (two) times daily as needed.   fexofenadine (ALLEGRA) 180 MG tablet Take 180 mg by mouth daily.   losartan (COZAAR) 100 MG tablet Take 1 tablet (100 mg total) by mouth daily.    metFORMIN (GLUCOPHAGE) 1000 MG tablet Take 1 tablet (1,000 mg total) by mouth 2 (two) times daily with a meal.   mupirocin ointment (BACTROBAN) 2 % Apply to affected area bid   omeprazole (PRILOSEC) 20 MG capsule Take 20 mg by mouth every morning.    pimecrolimus (ELIDEL) 1 % cream Apply  topically 2 (two) times daily as needed.   [DISCONTINUED] acyclovir ointment (ZOVIRAX) 5 % Apply 1 application topically daily as needed (fever blisters). Apply topical as directed   [DISCONTINUED] losartan (COZAAR) 100 MG tablet Take 1 tablet (100 mg total) by mouth daily.   [DISCONTINUED] metFORMIN (GLUCOPHAGE) 1000 MG tablet Take 1 tablet (1,000 mg total) by mouth 2 (two) times daily with a meal.   [DISCONTINUED] nystatin (MYCOSTATIN) 100000 UNIT/ML suspension Take 5 mLs (500,000 Units total) by mouth 3 (three) times daily as needed. Swish and spit tid   [DISCONTINUED] pimecrolimus (ELIDEL) 1 % cream Apply topically 2 (two) times daily as needed. (Patient taking differently: Apply 1 application topically 2 (two) times daily as needed (eczema). )   No facility-administered encounter medications on file as of 06/19/2021.     Lab Results  Component Value Date   WBC 6.3 11/11/2020   HGB 13.4 11/11/2020   HCT 39.1 11/11/2020   PLT 202 11/11/2020   GLUCOSE 138 (H) 06/19/2021   CHOL 255 (H) 06/19/2021   TRIG 254 (H) 06/19/2021   HDL 74 06/19/2021   LDLCALC 136 (H) 06/19/2021   ALT 25 06/19/2021   AST 29 06/19/2021   NA 140 06/19/2021   K 5.0 06/19/2021   CL 101 06/19/2021   CREATININE 0.60 06/19/2021   BUN 10 06/19/2021   CO2 23 06/19/2021   TSH 1.810 06/19/2021   HGBA1C 6.7 (H) 06/19/2021    MM 3D SCREEN BREAST BILATERAL  Result Date: 05/31/2020 CLINICAL DATA:  Screening. EXAM: DIGITAL SCREENING BILATERAL MAMMOGRAM WITH TOMO AND CAD COMPARISON:  Previous exam(s). ACR Breast Density Category b: There are scattered areas of fibroglandular density. FINDINGS: There are no findings suspicious for  malignancy. Images were processed with CAD. IMPRESSION: No mammographic evidence of malignancy. A result letter of this screening mammogram will be mailed directly to the patient. RECOMMENDATION: Screening mammogram in one year. (Code:SM-B-01Y) BI-RADS CATEGORY  1: Negative. Electronically Signed   By: Lajean Manes M.D.   On: 05/31/2020 08:02       Assessment & Plan:   Problem List Items Addressed This Visit     BMI 45.0-49.9, adult ([place])    Low carb diet and exercise.  Follow.       Relevant Medications   metFORMIN (GLUCOPHAGE) 1000 MG tablet   Diabetes mellitus ([place])    Continues on metformin.  Discussed diet and exercise. Follow met b and a1c.       Relevant Medications   losartan (COZAAR) 100 MG tablet   metFORMIN (GLUCOPHAGE) 1000 MG tablet   Other Relevant Orders   Hemoglobin A1c (Completed)   Essential hypertension, benign    Blood pressure as outlined.  On losartan 153m q day.  Discussed medication.  Consider adding low dose amlodipine. Discussed. She wanted name of medication to research.  Will let me know if agreeable.  Follow pressures.  Follow metabolic panel.        Relevant Medications   losartan (COZAAR) 100 MG tablet   Other Relevant Orders   Basic metabolic panel (Completed)   TSH (Completed)   GERD (gastroesophageal reflux disease)    No acid reflux symptoms.  Omeprazole.        Health care maintenance    Physical today 06/19/21.  PAP 02/28/20 negative with negative HPV.  Colonoscopy 07/2012.  Recommended f/u in 10 years.  Hemoccult cards given.       Hypercholesterolemia    Low-cholesterol diet and exercise.  Have discussed calculated cholesterol  results.  She has wanted to work on diet and exercise and hold on medication.  Follow.       Relevant Medications   losartan (COZAAR) 100 MG tablet   Other Relevant Orders   Hepatic function panel (Completed)   Lipid panel (Completed)   Stress    Overall appears to be doing well.  Follow.       Other  Visit Diagnoses     Routine general medical examination at a health care facility    -  Primary   Visit for screening mammogram       Relevant Orders   MM 3D SCREEN BREAST BILATERAL   Colon cancer screening       Relevant Orders   Fecal occult blood, imunochemical   Need for immunization against influenza       Relevant Orders   Flu Vaccine QUAD 69moIM (Fluarix, Fluzone & Alfiuria Quad PF) (Completed)        CEinar Pheasant MD

## 2021-06-20 DIAGNOSIS — Z23 Encounter for immunization: Secondary | ICD-10-CM

## 2021-06-20 LAB — BASIC METABOLIC PANEL
BUN/Creatinine Ratio: 17 (ref 9–23)
BUN: 10 mg/dL (ref 6–24)
CO2: 23 mmol/L (ref 20–29)
Calcium: 9.4 mg/dL (ref 8.7–10.2)
Chloride: 101 mmol/L (ref 96–106)
Creatinine, Ser: 0.6 mg/dL (ref 0.57–1.00)
Glucose: 138 mg/dL — ABNORMAL HIGH (ref 70–99)
Potassium: 5 mmol/L (ref 3.5–5.2)
Sodium: 140 mmol/L (ref 134–144)
eGFR: 103 mL/min/{1.73_m2} (ref >59)

## 2021-06-20 LAB — LIPID PANEL
Chol/HDL Ratio: 3.4 ratio (ref 0.0–4.4)
Cholesterol, Total: 255 mg/dL — ABNORMAL HIGH (ref 100–199)
HDL: 74 mg/dL (ref >39)
LDL Chol Calc (NIH): 136 mg/dL — ABNORMAL HIGH (ref 0–99)
Triglycerides: 254 mg/dL — ABNORMAL HIGH (ref 0–149)
VLDL Cholesterol Cal: 45 mg/dL — ABNORMAL HIGH (ref 5–40)

## 2021-06-20 LAB — HEPATIC FUNCTION PANEL
ALT: 25 IU/L (ref 0–32)
AST: 29 IU/L (ref 0–40)
Albumin: 4.5 g/dL (ref 3.8–4.9)
Alkaline Phosphatase: 108 IU/L (ref 44–121)
Bilirubin Total: 0.3 mg/dL (ref 0.0–1.2)
Bilirubin, Direct: <0.1 mg/dL (ref 0.00–0.40)
Total Protein: 6.9 g/dL (ref 6.0–8.5)

## 2021-06-20 LAB — TSH: TSH: 1.81 u[IU]/mL (ref 0.450–4.500)

## 2021-06-20 LAB — HEMOGLOBIN A1C
Est. average glucose Bld gHb Est-mCnc: 146 mg/dL
Hgb A1c MFr Bld: 6.7 % — ABNORMAL HIGH (ref 4.8–5.6)

## 2021-06-21 ENCOUNTER — Encounter: Payer: Self-pay | Admitting: Internal Medicine

## 2021-06-21 LAB — FECAL OCCULT BLOOD, IMMUNOCHEMICAL

## 2021-06-21 NOTE — Assessment & Plan Note (Signed)
Overall appears to be doing well.  Follow.  

## 2021-06-21 NOTE — Assessment & Plan Note (Signed)
No acid reflux symptoms.  Omeprazole.

## 2021-06-21 NOTE — Assessment & Plan Note (Signed)
Low carb diet and exercise.  Follow.  

## 2021-06-21 NOTE — Assessment & Plan Note (Signed)
Low-cholesterol diet and exercise.  Have discussed calculated cholesterol results.  She has wanted to work on diet and exercise and hold on medication.  Follow.

## 2021-06-21 NOTE — Assessment & Plan Note (Signed)
Continues on metformin.  Discussed diet and exercise. Follow met b and a1c.

## 2021-06-21 NOTE — Assessment & Plan Note (Addendum)
Blood pressure as outlined.  On losartan 100mg  q day.  Discussed medication.  Consider adding low dose amlodipine. Discussed. She wanted name of medication to research.  Will let me know if agreeable.  Follow pressures.  Follow metabolic panel.

## 2021-07-23 ENCOUNTER — Other Ambulatory Visit: Payer: Self-pay

## 2021-07-23 ENCOUNTER — Ambulatory Visit
Admission: RE | Admit: 2021-07-23 | Discharge: 2021-07-23 | Disposition: A | Discharge status: Home / Self Care | Payer: 59 | Source: Ambulatory Visit | Attending: Internal Medicine | Admitting: Internal Medicine

## 2021-07-23 DIAGNOSIS — Z1231 Encounter for screening mammogram for malignant neoplasm of breast: Secondary | ICD-10-CM | POA: Diagnosis not present

## 2021-08-07 ENCOUNTER — Ambulatory Visit: Payer: 59 | Admitting: Internal Medicine

## 2021-08-19 ENCOUNTER — Ambulatory Visit: Payer: 59 | Admitting: Internal Medicine

## 2021-09-30 ENCOUNTER — Ambulatory Visit: Payer: 59 | Admitting: Internal Medicine

## 2021-12-01 ENCOUNTER — Ambulatory Visit: Payer: 59 | Admitting: Internal Medicine

## 2021-12-20 ENCOUNTER — Other Ambulatory Visit: Payer: Self-pay | Admitting: Internal Medicine

## 2022-03-22 ENCOUNTER — Other Ambulatory Visit: Payer: Self-pay | Admitting: Internal Medicine

## 2022-07-09 ENCOUNTER — Other Ambulatory Visit: Payer: Self-pay | Admitting: Internal Medicine

## 2022-07-14 ENCOUNTER — Encounter: Payer: Self-pay | Admitting: Internal Medicine

## 2022-07-14 ENCOUNTER — Other Ambulatory Visit: Payer: Self-pay

## 2022-07-14 MED ORDER — LOSARTAN POTASSIUM 100 MG PO TABS: 100.0000 mg | ORAL_TABLET | Freq: Every day | ORAL | 1 refills | Status: DC

## 2022-09-03 ENCOUNTER — Encounter: Payer: Self-pay | Admitting: Internal Medicine

## 2022-09-14 ENCOUNTER — Ambulatory Visit: Payer: 59 | Admitting: Internal Medicine

## 2022-09-18 ENCOUNTER — Ambulatory Visit: Payer: 59 | Admitting: Internal Medicine

## 2022-10-20 ENCOUNTER — Ambulatory Visit: Payer: 59 | Admitting: Internal Medicine

## 2022-10-20 ENCOUNTER — Encounter: Payer: Self-pay | Admitting: Internal Medicine

## 2022-10-20 VITALS — BP 134/88 | HR 82 | Temp 98.0°F | Resp 16 | Ht 66.0 in | Wt 300.0 lb

## 2022-10-20 DIAGNOSIS — R051 Acute cough: Secondary | ICD-10-CM

## 2022-10-20 DIAGNOSIS — I10 Essential (primary) hypertension: Secondary | ICD-10-CM | POA: Diagnosis not present

## 2022-10-20 DIAGNOSIS — Z1211 Encounter for screening for malignant neoplasm of colon: Secondary | ICD-10-CM

## 2022-10-20 DIAGNOSIS — Z1231 Encounter for screening mammogram for malignant neoplasm of breast: Secondary | ICD-10-CM

## 2022-10-20 DIAGNOSIS — E1165 Type 2 diabetes mellitus with hyperglycemia: Secondary | ICD-10-CM | POA: Diagnosis not present

## 2022-10-20 DIAGNOSIS — Z7984 Long term (current) use of oral hypoglycemic drugs: Secondary | ICD-10-CM

## 2022-10-20 DIAGNOSIS — Z86010 Personal history of colonic polyps: Secondary | ICD-10-CM

## 2022-10-20 DIAGNOSIS — Z6841 Body Mass Index (BMI) 40.0 and over, adult: Secondary | ICD-10-CM

## 2022-10-20 DIAGNOSIS — E7800 Pure hypercholesterolemia, unspecified: Secondary | ICD-10-CM | POA: Diagnosis not present

## 2022-10-20 DIAGNOSIS — F439 Reaction to severe stress, unspecified: Secondary | ICD-10-CM

## 2022-10-20 DIAGNOSIS — K219 Gastro-esophageal reflux disease without esophagitis: Secondary | ICD-10-CM

## 2022-10-20 LAB — HM DIABETES FOOT EXAM

## 2022-10-20 MED ORDER — ALBUTEROL SULFATE HFA 108 (90 BASE) MCG/ACT IN AERS: 2.0000 | INHALATION_SPRAY | Freq: Four times a day (QID) | RESPIRATORY_TRACT | 1 refills | Status: DC | PRN

## 2022-10-20 MED ORDER — CEFDINIR 300 MG PO CAPS: 300.0000 mg | ORAL_CAPSULE | Freq: Two times a day (BID) | ORAL | 0 refills | Status: DC

## 2022-10-20 NOTE — Patient Instructions (Signed)
Saline nasal spray - flush nose 2x/day  Nasacort nasal spray - 2 sprays each nostril one time per day.  Do this in the evening.  

## 2022-10-20 NOTE — Progress Notes (Signed)
Subjective:    Patient ID: Wendy Callahan, female    DOB: June 25, 1960, 62 y.o.   MRN: [id number]  Patient here for  Chief Complaint  Patient presents with   Medical Management of Chronic Issues    HPI Here to follow up regarding diabetes, hypertension and hypercholesterolemia. Reports that starting one week ago, increased cough.  Productive cough. Worse at night.  Has been taking mucinex and dayquil/nyquil.  No fever.  Some wheezing.  Yellow mucus production.  Occasional headache.  No vomiting or diarrhea.  Husband had some cough.  No chest pain reported.  Due colonoscopy.    Past Medical History:  Diagnosis Date   Allergy    Arthritis    Environmental allergies    GERD (gastroesophageal reflux disease)    Hypercholesterolemia    Hypertension    Murmur    Pre-diabetes    Seizure (HCC)    AS A CHILD   Past Surgical History:  Procedure Laterality Date   BLADDER SURGERY  1973   COLONOSCOPY  2014   Dr Mechele Collin   TUBAL LIGATION  10/1991   UPPER GI ENDOSCOPY  2015   Dr Mechele Collin   VENTRAL HERNIA REPAIR N/A 07/18/2018   10 x 12 cm Bard soft mesh retrorectus space HERNIA REPAIR VENTRAL ADULT WITH COMPONENT SEPERATION;  Surgeon: Earline Mayotte, MD;  Location: [facility] ORS;  Service: General;  Laterality: N/A;   Family History  Adopted: Yes  Problem Relation Age of Onset   Congenital heart disease Son        Tetrology of fallot   Social History   Socioeconomic History   Marital status: Married    Spouse name: Not on file   Number of children: 2   Years of education: Not on file   Highest education level: Not on file  Occupational History   Occupation: PROJECT ANALYST    Employer: [employer]    Comment: Business Information System  Tobacco Use   Smoking status: Former    Types: Cigarettes   Smokeless tobacco: Never  Vaping Use   Vaping Use: Never used  Substance and Sexual Activity   Alcohol use: Yes    Alcohol/week: 0.0 standard drinks of alcohol    Comment:  occasional   Drug use: No   Sexual activity: Not on file  Other Topics Concern   Not on file  Social History Narrative   Not on file   Social Determinants of Health   Financial Resource Strain: Not on file  Food Insecurity: Not on file  Transportation Needs: Not on file  Physical Activity: Not on file  Stress: Not on file  Social Connections: Not on file     Review of Systems  Constitutional:  Negative for appetite change and fever.  HENT:  Positive for congestion and postnasal drip. Negative for sinus pressure.   Respiratory:  Positive for cough and wheezing. Negative for chest tightness and shortness of breath.   Cardiovascular:  Negative for chest pain and palpitations.  Gastrointestinal:  Negative for abdominal pain, diarrhea, nausea and vomiting.  Genitourinary:  Negative for difficulty urinating and dysuria.  Musculoskeletal:  Negative for joint swelling and myalgias.  Skin:  Negative for color change and rash.  Neurological:  Negative for dizziness.       Occasional headache.   Psychiatric/Behavioral:  Negative for agitation and dysphoric mood.        Objective:     BP 134/88   Pulse 82   Temp 98  F (36.7 C)   Resp 16   Ht 5\' 6"  (1.676 m)   Wt 300 lb (136.1 kg)   LMP 05/18/2016   SpO2 97%   BMI 48.42 kg/m  Wt Readings from Last 3 Encounters:  10/20/22 300 lb (136.1 kg)  06/19/21 298 lb (135.2 kg)  11/11/20 (!) 300 lb 12.8 oz (136.4 kg)    Physical Exam Vitals reviewed.  Constitutional:      General: She is not in acute distress.    Appearance: Normal appearance.  HENT:     Head: Normocephalic and atraumatic.     Right Ear: External ear normal.     Left Ear: External ear normal.  Eyes:     General: No scleral icterus.       Right eye: No discharge.        Left eye: No discharge.     Conjunctiva/sclera: Conjunctivae normal.  Neck:     Thyroid: No thyromegaly.  Cardiovascular:     Rate and Rhythm: Normal rate and regular rhythm.  Pulmonary:      Effort: No respiratory distress.     Breath sounds: Normal breath sounds. No wheezing.  Abdominal:     General: Bowel sounds are normal.     Palpations: Abdomen is soft.     Tenderness: There is no abdominal tenderness.  Musculoskeletal:        General: No swelling or tenderness.     Cervical back: Neck supple. No tenderness.  Lymphadenopathy:     Cervical: No cervical adenopathy.  Skin:    Findings: No erythema or rash.  Neurological:     Mental Status: She is alert.  Psychiatric:        Mood and Affect: Mood normal.        Behavior: Behavior normal.      Outpatient Encounter Medications as of 10/20/2022  Medication Sig   albuterol (VENTOLIN HFA) 108 (90 Base) MCG/ACT inhaler Inhale 2 puffs into the lungs every 6 (six) hours as needed for wheezing or shortness of breath.   cefdinir (OMNICEF) 300 MG capsule Take 1 capsule (300 mg total) by mouth 2 (two) times daily.   Acetaminophen (TYLENOL ARTHRITIS PAIN PO) Take 1,300 mg by mouth 2 (two) times daily.    acyclovir ointment (ZOVIRAX) 5 % Apply 1 application topically daily as needed (fever blisters). Apply topical as directed   clotrimazole-betamethasone (LOTRISONE) cream Apply 1 application topically 2 (two) times daily as needed.   fexofenadine (ALLEGRA) 180 MG tablet Take 180 mg by mouth daily.   losartan (COZAAR) 100 MG tablet Take 1 tablet (100 mg total) by mouth daily.   metFORMIN (GLUCOPHAGE) 1000 MG tablet TAKE ONE TABLET BY MOUTH TWICE A DAY WITH A MEAL   mupirocin ointment (BACTROBAN) 2 % Apply to affected area bid   omeprazole (PRILOSEC) 20 MG capsule Take 20 mg by mouth every morning.    pimecrolimus (ELIDEL) 1 % cream Apply topically 2 (two) times daily as needed.   No facility-administered encounter medications on file as of 10/20/2022.     Lab Results  Component Value Date   WBC 6.3 11/11/2020   HGB 13.4 11/11/2020   HCT 39.1 11/11/2020   PLT 202 11/11/2020   GLUCOSE 138 (H) 06/19/2021   CHOL 255 (H)  06/19/2021   TRIG 254 (H) 06/19/2021   HDL 74 06/19/2021   LDLCALC 136 (H) 06/19/2021   ALT 25 06/19/2021   AST 29 06/19/2021   NA 140 06/19/2021   K 5.0  06/19/2021   CL 101 06/19/2021   CREATININE 0.60 06/19/2021   BUN 10 06/19/2021   CO2 23 06/19/2021   TSH 1.810 06/19/2021   HGBA1C 6.7 (H) 06/19/2021    MM 3D SCREEN BREAST BILATERAL  Result Date: 07/24/2021 CLINICAL DATA:  Screening. EXAM: DIGITAL SCREENING BILATERAL MAMMOGRAM WITH TOMOSYNTHESIS AND CAD TECHNIQUE: Bilateral screening digital craniocaudal and mediolateral oblique mammograms were obtained. Bilateral screening digital breast tomosynthesis was performed. The images were evaluated with computer-aided detection. COMPARISON:  Previous exam(s). ACR Breast Density Category b: There are scattered areas of fibroglandular density. FINDINGS: There are no findings suspicious for malignancy. IMPRESSION: No mammographic evidence of malignancy. A result letter of this screening mammogram will be mailed directly to the patient. RECOMMENDATION: Screening mammogram in one year. (Code:SM-B-01Y) BI-RADS CATEGORY  1: Negative. Electronically Signed   By: Gerome Sam III M.D.   On: 07/24/2021 11:17       Assessment & Plan:  Type 2 diabetes mellitus with hyperglycemia, without long-term current use of insulin (HCC) Assessment & Plan: Continues on metformin.  Discussed diet and exercise. Follow met b and a1c.   Orders: -     Basic metabolic panel; Future -     Hemoglobin A1c; Future -     Microalbumin / creatinine urine ratio; Future  Essential hypertension, benign Assessment & Plan: Blood pressure as outlined.  On losartan 100mg  q day.  Discussed medication.  Have discussed adding low dose amlodipine. Wants to hold on additional medication at this time.  Follow pressures.  Follow metabolic panel.     Hypercholesterolemia Assessment & Plan: Low-cholesterol diet and exercise.  Have discussed calculated cholesterol results.  She has  wanted to work on diet and exercise and hold on medication.  Follow.   Orders: -     CBC with Differential/Platelet; Future -     Hepatic function panel; Future -     TSH; Future -     Lipid panel; Future  Visit for screening mammogram -     3D Screening Mammogram, Left and Right; Future  Colon cancer screening -     Ambulatory referral to Gastroenterology  BMI 45.0-49.9, adult [facility]) Assessment & Plan: Low carb diet and exercise.  Follow.    Acute cough Assessment & Plan: Persistent cough and congestion as outlined.  Yellow mucus production.  Discussed URI.  Treat with abx as directed.  Saline nasal spray and steroid nasal spray as directed.  Hold on prednisone.  Follow.  Call with update.    Gastroesophageal reflux disease without esophagitis Assessment & Plan: No acid reflux symptoms.  Omeprazole.     History of colonic polyps Assessment & Plan: Colonoscopy 07/2012.  Recommended f/u colonoscopy in 10 years.  Discussed due colonoscopy.  Agreeable for referral.    Stress Assessment & Plan: Overall appears to be doing well.  Follow.    Other orders -     Cefdinir; Take 1 capsule (300 mg total) by mouth 2 (two) times daily.  Dispense: 20 capsule; Refill: 0 -     Albuterol Sulfate HFA; Inhale 2 puffs into the lungs every 6 (six) hours as needed for wheezing or shortness of breath.  Dispense: 18 g; Refill: 1     Dale Courtland, MD

## 2022-10-23 ENCOUNTER — Encounter: Payer: Self-pay | Admitting: Internal Medicine

## 2022-10-23 NOTE — Telephone Encounter (Signed)
Ok to use delsym.  With continuous coughing, congestion - would avoid close contact.  Agree with probiotic.  If persistent symptoms, will need to be evaluated.

## 2022-10-23 NOTE — Telephone Encounter (Signed)
Spoke with patient. She has not had any diarrhea since last night. Advised patient to take probiotic and see if this helps. Has felt some better through out the day today. Cough is much worse at night when trying to lay down, pt is using nyquil severe cough. Recommended robitussin DM or delsym- which would you prefer her use? Also wants to know if you think she is contagious? No fever or any worsening symptoms since office visit.

## 2022-10-25 ENCOUNTER — Encounter: Payer: Self-pay | Admitting: Internal Medicine

## 2022-10-25 NOTE — Assessment & Plan Note (Signed)
Blood pressure as outlined.  On losartan 100mg  q day.  Discussed medication.  Have discussed adding low dose amlodipine. Wants to hold on additional medication at this time.  Follow pressures.  Follow metabolic panel.

## 2022-10-25 NOTE — Assessment & Plan Note (Signed)
Low-cholesterol diet and exercise.  Have discussed calculated cholesterol results.  She has wanted to work on diet and exercise and hold on medication.  Follow.  °

## 2022-10-25 NOTE — Assessment & Plan Note (Signed)
No acid reflux symptoms.  Omeprazole.   °

## 2022-10-25 NOTE — Assessment & Plan Note (Signed)
Low carb diet and exercise.  Follow.  

## 2022-10-25 NOTE — Assessment & Plan Note (Signed)
Overall appears to be doing well.  Follow.  

## 2022-10-25 NOTE — Assessment & Plan Note (Signed)
Persistent cough and congestion as outlined.  Yellow mucus production.  Discussed URI.  Treat with abx as directed.  Saline nasal spray and steroid nasal spray as directed.  Hold on prednisone.  Follow.  Call with update.

## 2022-10-25 NOTE — Assessment & Plan Note (Signed)
Continues on metformin.  Discussed diet and exercise. Follow met b and a1c.  °

## 2022-10-25 NOTE — Assessment & Plan Note (Signed)
Colonoscopy 07/2012.  Recommended f/u colonoscopy in 10 years.  Discussed due colonoscopy.  Agreeable for referral.

## 2022-11-04 LAB — HM DIABETES EYE EXAM

## 2022-11-10 ENCOUNTER — Other Ambulatory Visit (INDEPENDENT_AMBULATORY_CARE_PROVIDER_SITE_OTHER): Payer: 59

## 2022-11-10 DIAGNOSIS — E1165 Type 2 diabetes mellitus with hyperglycemia: Secondary | ICD-10-CM | POA: Diagnosis not present

## 2022-11-10 DIAGNOSIS — E7800 Pure hypercholesterolemia, unspecified: Secondary | ICD-10-CM

## 2022-11-10 NOTE — Addendum Note (Signed)
Addended by: Warden Fillers on: 11/10/2022 09:46 AM   Modules accepted: Orders

## 2022-11-11 LAB — CBC WITH DIFFERENTIAL/PLATELET
Basophils Absolute: 0 10*3/uL (ref 0.0–0.2)
Basos: 0 %
EOS (ABSOLUTE): 0.2 10*3/uL (ref 0.0–0.4)
Eos: 3 %
Hematocrit: 44.5 % (ref 34.0–46.6)
Hemoglobin: 14.5 g/dL (ref 11.1–15.9)
Immature Grans (Abs): 0 10*3/uL (ref 0.0–0.1)
Immature Granulocytes: 0 %
Lymphocytes Absolute: 1.5 10*3/uL (ref 0.7–3.1)
Lymphs: 22 %
MCH: 30.8 pg (ref 26.6–33.0)
MCHC: 32.6 g/dL (ref 31.5–35.7)
MCV: 95 fL (ref 79–97)
Monocytes Absolute: 0.4 10*3/uL (ref 0.1–0.9)
Monocytes: 6 %
Neutrophils Absolute: 4.8 10*3/uL (ref 1.4–7.0)
Neutrophils: 69 %
Platelets: 185 10*3/uL (ref 150–450)
RBC: 4.71 x10E6/uL (ref 3.77–5.28)
RDW: 13 % (ref 11.7–15.4)
WBC: 7 10*3/uL (ref 3.4–10.8)

## 2022-11-11 LAB — LIPID PANEL
Chol/HDL Ratio: 3.4 ratio (ref 0.0–4.4)
Cholesterol, Total: 275 mg/dL — ABNORMAL HIGH (ref 100–199)
HDL: 81 mg/dL (ref >39)
LDL Chol Calc (NIH): 155 mg/dL — ABNORMAL HIGH (ref 0–99)
Triglycerides: 218 mg/dL — ABNORMAL HIGH (ref 0–149)
VLDL Cholesterol Cal: 39 mg/dL (ref 5–40)

## 2022-11-11 LAB — HEPATIC FUNCTION PANEL
ALT: 34 IU/L — ABNORMAL HIGH (ref 0–32)
AST: 54 IU/L — ABNORMAL HIGH (ref 0–40)
Albumin: 4.4 g/dL (ref 3.8–4.9)
Alkaline Phosphatase: 150 IU/L — ABNORMAL HIGH (ref 44–121)
Bilirubin Total: 0.4 mg/dL (ref 0.0–1.2)
Bilirubin, Direct: 0.13 mg/dL (ref 0.00–0.40)
Total Protein: 7.3 g/dL (ref 6.0–8.5)

## 2022-11-11 LAB — BASIC METABOLIC PANEL
BUN/Creatinine Ratio: 12 (ref 12–28)
BUN: 8 mg/dL (ref 8–27)
CO2: 23 mmol/L (ref 20–29)
Calcium: 9.6 mg/dL (ref 8.7–10.3)
Chloride: 101 mmol/L (ref 96–106)
Creatinine, Ser: 0.65 mg/dL (ref 0.57–1.00)
Glucose: 227 mg/dL — ABNORMAL HIGH (ref 70–99)
Potassium: 5 mmol/L (ref 3.5–5.2)
Sodium: 141 mmol/L (ref 134–144)
eGFR: 101 mL/min/{1.73_m2} (ref >59)

## 2022-11-11 LAB — MICROALBUMIN / CREATININE URINE RATIO
Creatinine, Urine: 176.5 mg/dL
Microalb/Creat Ratio: 166 mg/g creat — ABNORMAL HIGH (ref 0–29)
Microalbumin, Urine: 292.6 ug/mL

## 2022-11-11 LAB — TSH: TSH: 2.24 u[IU]/mL (ref 0.450–4.500)

## 2022-11-11 LAB — HEMOGLOBIN A1C
Est. average glucose Bld gHb Est-mCnc: 189 mg/dL
Hgb A1c MFr Bld: 8.2 % — ABNORMAL HIGH (ref 4.8–5.6)

## 2022-11-12 ENCOUNTER — Telehealth: Payer: Self-pay

## 2022-11-12 NOTE — Telephone Encounter (Signed)
-----   Message from Dale [place], MD sent at 11/12/2022  4:26 AM EDT ----- Notify - overall sugar control has increased.  (Increased from 6.7 to 8.2).  she needs an appt soon to come in to discuss treatment options.  Does she check her sugars?  Liver function tests are elevated.  This can be related to fatty liver.  Low carb diet and exercise.  Will need to recheck liver panel.  (Can do this when she comes in for appt to discus sher sugars). Cholesterol has increased.  Will discuss further treatment at appt. Hgb and thyroid test wnl.

## 2022-12-12 ENCOUNTER — Other Ambulatory Visit: Payer: Self-pay | Admitting: Internal Medicine

## 2022-12-31 ENCOUNTER — Encounter (INDEPENDENT_AMBULATORY_CARE_PROVIDER_SITE_OTHER): Payer: Self-pay

## 2023-01-21 ENCOUNTER — Ambulatory Visit: Payer: 59 | Admitting: Internal Medicine

## 2023-01-21 ENCOUNTER — Encounter: Payer: Self-pay | Admitting: Internal Medicine

## 2023-01-21 VITALS — BP 134/72 | HR 73 | Temp 98.2°F | Ht 66.0 in | Wt 298.6 lb

## 2023-01-21 DIAGNOSIS — R7989 Other specified abnormal findings of blood chemistry: Secondary | ICD-10-CM

## 2023-01-21 DIAGNOSIS — E1165 Type 2 diabetes mellitus with hyperglycemia: Secondary | ICD-10-CM | POA: Diagnosis not present

## 2023-01-21 DIAGNOSIS — I10 Essential (primary) hypertension: Secondary | ICD-10-CM

## 2023-01-21 DIAGNOSIS — E7800 Pure hypercholesterolemia, unspecified: Secondary | ICD-10-CM

## 2023-01-21 DIAGNOSIS — Z23 Encounter for immunization: Secondary | ICD-10-CM

## 2023-01-21 DIAGNOSIS — Z7984 Long term (current) use of oral hypoglycemic drugs: Secondary | ICD-10-CM | POA: Diagnosis not present

## 2023-01-21 DIAGNOSIS — Z86010 Personal history of colonic polyps: Secondary | ICD-10-CM

## 2023-01-21 DIAGNOSIS — Z6841 Body Mass Index (BMI) 40.0 and over, adult: Secondary | ICD-10-CM

## 2023-01-21 NOTE — Progress Notes (Signed)
Subjective:    Patient ID: Wendy Callahan, female    DOB: 25-Jul-1960, 62 y.o.   MRN: [id number]  Patient here for  Chief Complaint  Patient presents with   Medication Management    HPI Here to follow up regarding diabetes, hypertension and hypercholesterolemia. Sugar elevated last labs.  She just started on metformin bid.  Has been taking for 3-4 weeks. Discussed starting other medication.  She just started and is now exercising - walking 2x/week.  Wants to hold on additional medication.  She has started fish oil, super beets and sugar balance. Discussed diet and exercise.  Discussed importance of better sugar control. Eye check 11/2022 [facility].  No chest pain.  Breathing stable. No increased cough or congestion.    Past Medical History:  Diagnosis Date   Allergy    Arthritis    Environmental allergies    GERD (gastroesophageal reflux disease)    Hypercholesterolemia    Hypertension    Murmur    Pre-diabetes    Seizure (HCC)    AS A CHILD   Past Surgical History:  Procedure Laterality Date   BLADDER SURGERY  1973   COLONOSCOPY  2014   Dr Mechele Collin   TUBAL LIGATION  10/1991   UPPER GI ENDOSCOPY  2015   Dr Mechele Collin   VENTRAL HERNIA REPAIR N/A 07/18/2018   10 x 12 cm Bard soft mesh retrorectus space HERNIA REPAIR VENTRAL ADULT WITH COMPONENT SEPERATION;  Surgeon: Earline Mayotte, MD;  Location: [facility] ORS;  Service: General;  Laterality: N/A;   Family History  Adopted: Yes  Problem Relation Age of Onset   Congenital heart disease Son        Tetrology of fallot   Social History   Socioeconomic History   Marital status: Married    Spouse name: Not on file   Number of children: 2   Years of education: Not on file   Highest education level: Not on file  Occupational History   Occupation: PROJECT ANALYST    Employer: [employer]    Comment: Business Information System  Tobacco Use   Smoking status: Former    Types: Cigarettes   Smokeless tobacco: Never  Vaping  Use   Vaping status: Never Used  Substance and Sexual Activity   Alcohol use: Yes    Alcohol/week: 0.0 standard drinks of alcohol    Comment: occasional   Drug use: No   Sexual activity: Not on file  Other Topics Concern   Not on file  Social History Narrative   Not on file   Social Determinants of Health   Financial Resource Strain: Not on file  Food Insecurity: Not on file  Transportation Needs: Not on file  Physical Activity: Not on file  Stress: Not on file  Social Connections: Not on file     Review of Systems  Constitutional:  Negative for appetite change and unexpected weight change.  HENT:  Negative for congestion and sinus pressure.   Respiratory:  Negative for cough, chest tightness and shortness of breath.   Cardiovascular:  Negative for chest pain, palpitations and leg swelling.  Gastrointestinal:  Negative for abdominal pain, diarrhea, nausea and vomiting.  Genitourinary:  Negative for difficulty urinating and dysuria.  Musculoskeletal:  Negative for joint swelling and myalgias.  Skin:  Negative for color change and rash.  Neurological:  Negative for dizziness and headaches.  Psychiatric/Behavioral:  Negative for agitation and dysphoric mood.        Objective:  BP 134/72   Pulse 73   Temp 98.2 F (36.8 C) (Oral)   Ht 5\' 6"  (1.676 m)   Wt 298 lb 9.6 oz (135.4 kg)   LMP 05/18/2016   SpO2 97%   BMI 48.20 kg/m  Wt Readings from Last 3 Encounters:  01/21/23 298 lb 9.6 oz (135.4 kg)  10/20/22 300 lb (136.1 kg)  06/19/21 298 lb (135.2 kg)    Physical Exam Vitals reviewed.  Constitutional:      General: She is not in acute distress.    Appearance: Normal appearance.  HENT:     Head: Normocephalic and atraumatic.     Right Ear: External ear normal.     Left Ear: External ear normal.  Eyes:     General: No scleral icterus.       Right eye: No discharge.        Left eye: No discharge.     Conjunctiva/sclera: Conjunctivae normal.  Neck:      Thyroid: No thyromegaly.  Cardiovascular:     Rate and Rhythm: Normal rate and regular rhythm.  Pulmonary:     Effort: No respiratory distress.     Breath sounds: Normal breath sounds. No wheezing.  Abdominal:     General: Bowel sounds are normal.     Palpations: Abdomen is soft.     Tenderness: There is no abdominal tenderness.  Musculoskeletal:        General: No swelling or tenderness.     Cervical back: Neck supple. No tenderness.  Lymphadenopathy:     Cervical: No cervical adenopathy.  Skin:    Findings: No erythema or rash.  Neurological:     Mental Status: She is alert.  Psychiatric:        Mood and Affect: Mood normal.        Behavior: Behavior normal.      Outpatient Encounter Medications as of 01/21/2023  Medication Sig   Acetaminophen (TYLENOL ARTHRITIS PAIN PO) Take 1,300 mg by mouth 2 (two) times daily.    acyclovir ointment (ZOVIRAX) 5 % Apply 1 application topically daily as needed (fever blisters). Apply topical as directed   albuterol (VENTOLIN HFA) 108 (90 Base) MCG/ACT inhaler Inhale 2 puffs into the lungs every 6 (six) hours as needed for wheezing or shortness of breath.   cefdinir (OMNICEF) 300 MG capsule Take 1 capsule (300 mg total) by mouth 2 (two) times daily.   clotrimazole-betamethasone (LOTRISONE) cream Apply 1 application topically 2 (two) times daily as needed.   fexofenadine (ALLEGRA) 180 MG tablet Take 180 mg by mouth daily.   losartan (COZAAR) 100 MG tablet TAKE 1 TABLET BY MOUTH DAILY   metFORMIN (GLUCOPHAGE) 1000 MG tablet TAKE ONE TABLET BY MOUTH TWICE A DAY WITH A MEAL   mupirocin ointment (BACTROBAN) 2 % Apply to affected area bid   omeprazole (PRILOSEC) 20 MG capsule Take 20 mg by mouth every morning.    pimecrolimus (ELIDEL) 1 % cream Apply topically 2 (two) times daily as needed.   No facility-administered encounter medications on file as of 01/21/2023.     Lab Results  Component Value Date   WBC 7.0 11/10/2022   HGB 14.5 11/10/2022    HCT 44.5 11/10/2022   PLT 185 11/10/2022   GLUCOSE 227 (H) 11/10/2022   CHOL 275 (H) 11/10/2022   TRIG 218 (H) 11/10/2022   HDL 81 11/10/2022   LDLCALC 155 (H) 11/10/2022   ALT 34 (H) 11/10/2022   AST 54 (H) 11/10/2022  NA 141 11/10/2022   K 5.0 11/10/2022   CL 101 11/10/2022   CREATININE 0.65 11/10/2022   BUN 8 11/10/2022   CO2 23 11/10/2022   TSH 2.240 11/10/2022   HGBA1C 8.2 (H) 11/10/2022    MM 3D SCREEN BREAST BILATERAL  Result Date: 07/24/2021 CLINICAL DATA:  Screening. EXAM: DIGITAL SCREENING BILATERAL MAMMOGRAM WITH TOMOSYNTHESIS AND CAD TECHNIQUE: Bilateral screening digital craniocaudal and mediolateral oblique mammograms were obtained. Bilateral screening digital breast tomosynthesis was performed. The images were evaluated with computer-aided detection. COMPARISON:  Previous exam(s). ACR Breast Density Category b: There are scattered areas of fibroglandular density. FINDINGS: There are no findings suspicious for malignancy. IMPRESSION: No mammographic evidence of malignancy. A result letter of this screening mammogram will be mailed directly to the patient. RECOMMENDATION: Screening mammogram in one year. (Code:SM-B-01Y) BI-RADS CATEGORY  1: Negative. Electronically Signed   By: Gerome Sam III M.D.   On: 07/24/2021 11:17       Assessment & Plan:  Elevated LFTs -     Hepatic function panel; Future  Type 2 diabetes mellitus with hyperglycemia, without long-term current use of insulin (HCC) Assessment & Plan: On metformin.  Started back 3-4 weeks ago. Discussed diet and exercise. Follow met b and a1c.   Orders: -     Hemoglobin A1c; Future  Hypercholesterolemia Assessment & Plan: Low-cholesterol diet and exercise.  Have discussed calculated cholesterol results.  Desires not to take cholesterol medication. She has wanted to work on diet and exercise and hold on medication.  Follow.   Orders: -     Lipid panel; Future -     Basic metabolic panel;  Future  Encounter for immunization -     Flu vaccine trivalent PF, 6mos and older(Flulaval,Afluria,Fluarix,Fluzone)  History of colonic polyps Assessment & Plan: Colonoscopy 07/2012.  Recommended f/u colonoscopy in 10 years.  Discussed due colonoscopy.  Agreeable for referral. Agreeable for referral.    Essential hypertension, benign Assessment & Plan: Blood pressure as outlined.  On losartan 100mg  q day.  Discussed medication.  Have discussed adding low dose amlodipine. Wants to hold on additional medication at this time.  Follow pressures.  Follow metabolic panel.     BMI 45.0-49.9, adult [facility]) Assessment & Plan: Low carb diet and exercise.  Follow.       Dale [place], MD

## 2023-01-23 ENCOUNTER — Encounter: Payer: Self-pay | Admitting: Internal Medicine

## 2023-01-23 NOTE — Assessment & Plan Note (Signed)
Low-cholesterol diet and exercise.  Have discussed calculated cholesterol results.  Desires not to take cholesterol medication. She has wanted to work on diet and exercise and hold on medication.  Follow.

## 2023-01-23 NOTE — Assessment & Plan Note (Signed)
Blood pressure as outlined.  On losartan 100mg  q day.  Discussed medication.  Have discussed adding low dose amlodipine. Wants to hold on additional medication at this time.  Follow pressures.  Follow metabolic panel.

## 2023-01-23 NOTE — Assessment & Plan Note (Signed)
Low carb diet and exercise.  Follow.  

## 2023-01-23 NOTE — Assessment & Plan Note (Signed)
Colonoscopy 07/2012.  Recommended f/u colonoscopy in 10 years.  Discussed due colonoscopy.  Agreeable for referral. Agreeable for referral.

## 2023-01-23 NOTE — Assessment & Plan Note (Signed)
On metformin.  Started back 3-4 weeks ago. Discussed diet and exercise. Follow met b and a1c.

## 2023-02-09 ENCOUNTER — Ambulatory Visit
Admission: RE | Admit: 2023-02-09 | Discharge: 2023-02-09 | Disposition: A | Discharge status: Home / Self Care | Payer: 59 | Source: Ambulatory Visit | Attending: Internal Medicine | Admitting: Internal Medicine

## 2023-02-09 DIAGNOSIS — Z1231 Encounter for screening mammogram for malignant neoplasm of breast: Secondary | ICD-10-CM | POA: Diagnosis present

## 2023-03-04 ENCOUNTER — Other Ambulatory Visit (INDEPENDENT_AMBULATORY_CARE_PROVIDER_SITE_OTHER): Payer: 59

## 2023-03-04 DIAGNOSIS — R7989 Other specified abnormal findings of blood chemistry: Secondary | ICD-10-CM

## 2023-03-04 DIAGNOSIS — E1165 Type 2 diabetes mellitus with hyperglycemia: Secondary | ICD-10-CM

## 2023-03-04 DIAGNOSIS — E7800 Pure hypercholesterolemia, unspecified: Secondary | ICD-10-CM

## 2023-03-05 LAB — BASIC METABOLIC PANEL
BUN/Creatinine Ratio: 22 (ref 12–28)
BUN: 12 mg/dL (ref 8–27)
CO2: 23 mmol/L (ref 20–29)
Calcium: 9.3 mg/dL (ref 8.7–10.3)
Chloride: 102 mmol/L (ref 96–106)
Creatinine, Ser: 0.55 mg/dL — ABNORMAL LOW (ref 0.57–1.00)
Glucose: 192 mg/dL — ABNORMAL HIGH (ref 70–99)
Potassium: 4.9 mmol/L (ref 3.5–5.2)
Sodium: 142 mmol/L (ref 134–144)
eGFR: 105 mL/min/{1.73_m2} (ref >59)

## 2023-03-05 LAB — HEPATIC FUNCTION PANEL
ALT: 39 [IU]/L — ABNORMAL HIGH (ref 0–32)
AST: 42 [IU]/L — ABNORMAL HIGH (ref 0–40)
Albumin: 4.1 g/dL (ref 3.8–4.9)
Alkaline Phosphatase: 126 [IU]/L — ABNORMAL HIGH (ref 44–121)
Bilirubin Total: 0.4 mg/dL (ref 0.0–1.2)
Bilirubin, Direct: 0.13 mg/dL (ref 0.00–0.40)
Total Protein: 6.6 g/dL (ref 6.0–8.5)

## 2023-03-05 LAB — HEMOGLOBIN A1C
Est. average glucose Bld gHb Est-mCnc: 180 mg/dL
Hgb A1c MFr Bld: 7.9 % — ABNORMAL HIGH (ref 4.8–5.6)

## 2023-03-05 LAB — LIPID PANEL
Chol/HDL Ratio: 3.6 {ratio} (ref 0.0–4.4)
Cholesterol, Total: 227 mg/dL — ABNORMAL HIGH (ref 100–199)
HDL: 63 mg/dL (ref >39)
LDL Chol Calc (NIH): 120 mg/dL — ABNORMAL HIGH (ref 0–99)
Triglycerides: 253 mg/dL — ABNORMAL HIGH (ref 0–149)
VLDL Cholesterol Cal: 44 mg/dL — ABNORMAL HIGH (ref 5–40)

## 2023-03-09 ENCOUNTER — Telehealth: Payer: Self-pay

## 2023-03-09 NOTE — Telephone Encounter (Signed)
-----   Message from [place] sent at 03/07/2023 11:35 PM EDT ----- Notify - A1c remains elevated.  Has she been checking her sugars?  Needs appt to discuss treatment.  Bring in sugar readings.  Total and bad cholesterol improved some.  Triglycerides increased.  Low carb diet and exercise.  Liver function tests remain elevated.  Needs a f/u abdominal ultrasound to further evaluate the liver.  Also needs a referral to GI for further evaluation of elevated liver enzymes.  Kidney function tests wnl.

## 2023-03-10 ENCOUNTER — Telehealth: Payer: Self-pay

## 2023-03-10 NOTE — Telephone Encounter (Signed)
-----   Message from [place] sent at 03/07/2023 11:35 PM EDT ----- Notify - A1c remains elevated.  Has she been checking her sugars?  Needs appt to discuss treatment.  Bring in sugar readings.  Total and bad cholesterol improved some.  Triglycerides increased.  Low carb diet and exercise.  Liver function tests remain elevated.  Needs a f/u abdominal ultrasound to further evaluate the liver.  Also needs a referral to GI for further evaluation of elevated liver enzymes.  Kidney function tests wnl.

## 2023-03-17 ENCOUNTER — Encounter: Payer: Self-pay | Admitting: *Deleted

## 2023-03-26 ENCOUNTER — Ambulatory Visit: Payer: 59 | Admitting: Internal Medicine

## 2023-04-01 ENCOUNTER — Encounter: Payer: Self-pay | Admitting: Internal Medicine

## 2023-04-08 ENCOUNTER — Ambulatory Visit: Payer: 59 | Admitting: Internal Medicine

## 2023-04-21 ENCOUNTER — Encounter: Payer: Self-pay | Admitting: Internal Medicine

## 2023-04-21 ENCOUNTER — Encounter
Admission: RE | Disposition: A | Discharge status: Home / Self Care | Payer: Self-pay | Source: Home / Self Care | Attending: Internal Medicine

## 2023-04-21 ENCOUNTER — Ambulatory Visit
Admission: RE | Admit: 2023-04-21 | Discharge: 2023-04-21 | Disposition: A | Discharge status: Home / Self Care | Payer: 59 | Attending: Internal Medicine | Admitting: Internal Medicine

## 2023-04-21 ENCOUNTER — Ambulatory Visit: Payer: 59 | Admitting: Certified Registered"

## 2023-04-21 ENCOUNTER — Other Ambulatory Visit: Payer: Self-pay

## 2023-04-21 DIAGNOSIS — M069 Rheumatoid arthritis, unspecified: Secondary | ICD-10-CM | POA: Insufficient documentation

## 2023-04-21 DIAGNOSIS — Z1211 Encounter for screening for malignant neoplasm of colon: Secondary | ICD-10-CM | POA: Insufficient documentation

## 2023-04-21 DIAGNOSIS — D122 Benign neoplasm of ascending colon: Secondary | ICD-10-CM | POA: Diagnosis not present

## 2023-04-21 DIAGNOSIS — D128 Benign neoplasm of rectum: Secondary | ICD-10-CM | POA: Diagnosis not present

## 2023-04-21 DIAGNOSIS — K640 First degree hemorrhoids: Secondary | ICD-10-CM | POA: Insufficient documentation

## 2023-04-21 DIAGNOSIS — Z860101 Personal history of adenomatous and serrated colon polyps: Secondary | ICD-10-CM | POA: Insufficient documentation

## 2023-04-21 DIAGNOSIS — K219 Gastro-esophageal reflux disease without esophagitis: Secondary | ICD-10-CM | POA: Diagnosis not present

## 2023-04-21 DIAGNOSIS — Z79899 Other long term (current) drug therapy: Secondary | ICD-10-CM | POA: Insufficient documentation

## 2023-04-21 DIAGNOSIS — E66813 Obesity, class 3: Secondary | ICD-10-CM | POA: Diagnosis not present

## 2023-04-21 DIAGNOSIS — Z09 Encounter for follow-up examination after completed treatment for conditions other than malignant neoplasm: Secondary | ICD-10-CM | POA: Insufficient documentation

## 2023-04-21 DIAGNOSIS — Z87891 Personal history of nicotine dependence: Secondary | ICD-10-CM | POA: Insufficient documentation

## 2023-04-21 DIAGNOSIS — E7800 Pure hypercholesterolemia, unspecified: Secondary | ICD-10-CM | POA: Insufficient documentation

## 2023-04-21 DIAGNOSIS — I10 Essential (primary) hypertension: Secondary | ICD-10-CM | POA: Insufficient documentation

## 2023-04-21 DIAGNOSIS — E119 Type 2 diabetes mellitus without complications: Secondary | ICD-10-CM | POA: Diagnosis not present

## 2023-04-21 DIAGNOSIS — Z7984 Long term (current) use of oral hypoglycemic drugs: Secondary | ICD-10-CM | POA: Diagnosis not present

## 2023-04-21 DIAGNOSIS — Z6841 Body Mass Index (BMI) 40.0 and over, adult: Secondary | ICD-10-CM | POA: Insufficient documentation

## 2023-04-21 HISTORY — DX: Other specified congenital deformities of feet: Q66.89

## 2023-04-21 HISTORY — DX: Rheumatoid arthritis, unspecified: M06.9

## 2023-04-21 HISTORY — DX: Type 2 diabetes mellitus without complications: E11.9

## 2023-04-21 HISTORY — PX: POLYPECTOMY: SHX5525

## 2023-04-21 HISTORY — PX: HEMOSTASIS CLIP PLACEMENT: SHX6857

## 2023-04-21 HISTORY — DX: Personal history of adenomatous and serrated colon polyps: Z86.0101

## 2023-04-21 HISTORY — PX: COLONOSCOPY WITH PROPOFOL: SHX5780

## 2023-04-21 HISTORY — DX: Flatulence: R14.1

## 2023-04-21 SURGERY — COLONOSCOPY WITH PROPOFOL
Anesthesia: General

## 2023-04-21 MED ORDER — PROPOFOL 10 MG/ML IV BOLUS
INTRAVENOUS | Status: DC | PRN
  Administered 2023-04-21: 10 mg via INTRAVENOUS
  Administered 2023-04-21: 30 mg via INTRAVENOUS
  Administered 2023-04-21: 50 mg via INTRAVENOUS

## 2023-04-21 MED ORDER — SODIUM CHLORIDE 0.9 % IV SOLN: INTRAVENOUS | Status: DC

## 2023-04-21 MED ORDER — PROPOFOL 500 MG/50ML IV EMUL
INTRAVENOUS | Status: DC | PRN
  Administered 2023-04-21: 145 ug/kg/min via INTRAVENOUS

## 2023-04-21 NOTE — Anesthesia Procedure Notes (Signed)
Procedure Name: MAC Date/Time: 04/21/2023 9:19 AM  Performed by: Nelle Don, CRNAPre-anesthesia Checklist: Patient identified, Emergency Drugs available, Suction available and Patient being monitored Oxygen Delivery Method: Nasal cannula

## 2023-04-21 NOTE — H&P (Signed)
Outpatient short stay form Pre-procedure 04/21/2023 8:44 AM Berel Najjar K. Norma Fredrickson, M.D.  Primary Physician: Dale Chenango, M.D.  Reason for visit:  Colon cancer screening  History of present illness:  Patient presents for colonoscopy for colon cancer screening. The patient denies complaints of abdominal pain, significant change in bowel habits, or rectal bleeding.     No current facility-administered medications for this encounter.  Medications Prior to Admission  Medication Sig Dispense Refill Last Dose   Acetaminophen (TYLENOL ARTHRITIS PAIN PO) Take 1,300 mg by mouth 2 (two) times daily.       acyclovir ointment (ZOVIRAX) 5 % Apply 1 application topically daily as needed (fever blisters). Apply topical as directed 15 g 0    albuterol (VENTOLIN HFA) 108 (90 Base) MCG/ACT inhaler Inhale 2 puffs into the lungs every 6 (six) hours as needed for wheezing or shortness of breath. 18 g 1    cefdinir (OMNICEF) 300 MG capsule Take 1 capsule (300 mg total) by mouth 2 (two) times daily. 20 capsule 0    clotrimazole-betamethasone (LOTRISONE) cream Apply 1 application topically 2 (two) times daily as needed. 45 g 0    fexofenadine (ALLEGRA) 180 MG tablet Take 180 mg by mouth daily.      losartan (COZAAR) 100 MG tablet TAKE 1 TABLET BY MOUTH DAILY 90 tablet 1    metFORMIN (GLUCOPHAGE) 1000 MG tablet TAKE ONE TABLET BY MOUTH TWICE A DAY WITH A MEAL 180 tablet 1    mupirocin ointment (BACTROBAN) 2 % Apply to affected area bid 22 g 0    omeprazole (PRILOSEC) 20 MG capsule Take 20 mg by mouth every morning.       pimecrolimus (ELIDEL) 1 % cream Apply topically 2 (two) times daily as needed. 60 g 0      Allergies  Allergen Reactions   Latex     Chaps skin   Tetracyclines & Related Other (See Comments)    Yeast infection     Past Medical History:  Diagnosis Date   Allergy    Arthritis    Diabetes mellitus without complication (HCC)    Environmental allergies    Flatulence, eructation and gas  pain    GERD (gastroesophageal reflux disease)    Hx of adenomatous colonic polyps    Hypercholesterolemia    Hypertension    Murmur    Rheumatoid arthritis (HCC)    Seizure (HCC)    AS A CHILD   [place]     Review of systems:  Otherwise negative.    Physical Exam  Gen: Alert, oriented. Appears stated age.  HEENT: /AT. PERRLA. Lungs: CTA, no wheezes. CV: RR nl S1, S2. Abd: soft, benign, no masses. BS+ Ext: No edema. Pulses 2+    Planned procedures: Proceed with colonoscopy. The patient understands the nature of the planned procedure, indications, risks, alternatives and potential complications including but not limited to bleeding, infection, perforation, damage to internal organs and possible oversedation/side effects from anesthesia. The patient agrees and gives consent to proceed.  Please refer to procedure notes for findings, recommendations and patient disposition/instructions.     Aili Casillas K. Norma Fredrickson, M.D. Gastroenterology 04/21/2023  8:44 AM

## 2023-04-21 NOTE — Anesthesia Postprocedure Evaluation (Signed)
Anesthesia Post Note  Patient: Wendy Callahan  Procedure(s) Performed: COLONOSCOPY WITH PROPOFOL POLYPECTOMY HEMOSTASIS CLIP PLACEMENT  Patient location during evaluation: Endoscopy Anesthesia Type: General Level of consciousness: awake and alert Pain management: pain level controlled Vital Signs Assessment: post-procedure vital signs reviewed and stable Respiratory status: spontaneous breathing, nonlabored ventilation, respiratory function stable and patient connected to nasal cannula oxygen Cardiovascular status: blood pressure returned to baseline and stable Postop Assessment: no apparent nausea or vomiting Anesthetic complications: no   No notable events documented.   Last Vitals:  Vitals:   04/21/23 0939 04/21/23 0949  BP: 105/66 116/79  Pulse: 67 65  Resp: 17 17  Temp:    SpO2: 97% 98%    Last Pain:  Vitals:   04/21/23 0949  TempSrc:   PainSc: 0-No pain                 Corinda Gubler

## 2023-04-21 NOTE — Interval H&P Note (Signed)
History and Physical Interval Note:  04/21/2023 8:45 AM  [name]  has presented today for surgery, with the diagnosis of V76.51 (ICD-9-CM) - Z12.11 (ICD-10-CM) - Colon cancer screening.  The various methods of treatment have been discussed with the patient and family. After consideration of risks, benefits and other options for treatment, the patient has consented to  Procedure(s): COLONOSCOPY WITH PROPOFOL (N/A) as a surgical intervention.  The patient's history has been reviewed, patient examined, no change in status, stable for surgery.  I have reviewed the patient's chart and labs.  Questions were answered to the patient's satisfaction.     Wendy Callahan, [place]

## 2023-04-21 NOTE — Op Note (Signed)
[facility] Gastroenterology Patient Name: Georgeann Cohill Procedure Date: 04/21/2023 9:12 AM MRN: [id number] Account #: [id number] Date of Birth: Sep 15, 1960 Admit Type: Outpatient Age: 62 Room: [facility] ENDO ROOM 4 Gender: Female Note Status: Finalized Instrument Name: Prentice Docker [id number] Procedure:             Colonoscopy Indications:           Screening for colorectal malignant neoplasm Providers:             Royce Macadamia K. Norma Fredrickson MD, MD Referring MD:          Dale Conneaut, MD (Referring MD) Medicines:             Propofol per Anesthesia Complications:         No immediate complications. Procedure:             Pre-Anesthesia Assessment:                        - The risks and benefits of the procedure and the                         sedation options and risks were discussed with the                         patient. All questions were answered and informed                         consent was obtained.                        - Patient identification and proposed procedure were                         verified prior to the procedure by the nurse. The                         procedure was verified in the procedure room.                        - ASA Grade Assessment: III - A patient with severe                         systemic disease.                        - After reviewing the risks and benefits, the patient                         was deemed in satisfactory condition to undergo the                         procedure.                        After obtaining informed consent, the colonoscope was                         passed under direct vision. Throughout the procedure,                         the  patient's blood pressure, pulse, and oxygen                         saturations were monitored continuously. The                         Colonoscope was introduced through the anus and                         advanced to the the cecum, identified by appendiceal                          orifice and ileocecal valve. The colonoscopy was                         performed without difficulty. The patient tolerated                         the procedure well. The quality of the bowel                         preparation was adequate. The ileocecal valve,                         appendiceal orifice, and rectum were photographed. Findings:      The perianal and digital rectal examinations were normal. Pertinent       negatives include normal sphincter tone and no palpable rectal lesions.      Non-bleeding internal hemorrhoids were found during retroflexion. The       hemorrhoids were Grade I (internal hemorrhoids that do not prolapse).      An 8 mm polyp was found in the rectum. The polyp was sessile. The polyp       was removed with a cold snare. Resection and retrieval were complete.      An 11 mm polyp was found in the ascending colon. The polyp was sessile.       The polyp was removed with a cold snare. Resection and retrieval were       complete. To prevent bleeding after the polypectomy, one hemostatic clip       was successfully placed (MR conditional). Clip manufacturer: [employer]. There was no bleeding at the end of the procedure.      The exam was otherwise without abnormality. Impression:            - Non-bleeding internal hemorrhoids.                        - One 8 mm polyp in the rectum, removed with a cold                         snare. Resected and retrieved.                        - One 11 mm polyp in the ascending colon, removed with                         a cold snare. Resected and retrieved. Clip (MR  conditional) was placed. Clip manufacturer: [employer].                        - The examination was otherwise normal. Recommendation:        - Patient has a contact number available for                         emergencies. The signs and symptoms of potential                         delayed  complications were discussed with the patient.                         Return to normal activities tomorrow. Written                         discharge instructions were provided to the patient.                        - Resume previous diet.                        - Continue present medications.                        - Repeat colonoscopy is recommended for surveillance.                         The colonoscopy date will be determined after                         pathology results from today's exam become available                         for review.                        - Return to GI office PRN.                        - The findings and recommendations were discussed with                         the patient. Procedure Code(s):     --- Professional ---                        [phone], Colonoscopy, flexible; with removal of                         tumor(s), polyp(s), or other lesion(s) by snare                         technique Diagnosis Code(s):     --- Professional ---                        K64.0, First degree hemorrhoids  D12.2, Benign neoplasm of ascending colon                        D12.8, Benign neoplasm of rectum                        Z12.11, Encounter for screening for malignant neoplasm                         of colon CPT copyright 2022 American Medical Association. All rights reserved. The codes documented in this report are preliminary and upon coder review may  be revised to meet current compliance requirements. Stanton Kidney MD, MD 04/21/2023 9:39:07 AM This report has been signed electronically. Number of Addenda: 0 Note Initiated On: 04/21/2023 9:12 AM Scope Withdrawal Time: 0 hours 7 minutes 1 second  Total Procedure Duration: 0 hours 12 minutes 26 seconds  Estimated Blood Loss:  Estimated blood loss was minimal.      [facility]

## 2023-04-21 NOTE — Transfer of Care (Signed)
Immediate Anesthesia Transfer of Care Note  Patient: Wendy Callahan  Procedure(s) Performed: COLONOSCOPY WITH PROPOFOL POLYPECTOMY HEMOSTASIS CLIP PLACEMENT  Patient Location: PACU  Anesthesia Type:General  Level of Consciousness: awake, alert , and oriented  Airway & Oxygen Therapy: Patient Spontanous Breathing and Patient connected to face mask oxygen  Post-op Assessment: Report given to RN, Post -op Vital signs reviewed and stable, and Patient moving all extremities X 4  Post vital signs: Reviewed and stable  Last Vitals:  Vitals Value Taken Time  BP 105/66 04/21/23 0939  Temp    Pulse 68 04/21/23 0939  Resp 18 04/21/23 0939  SpO2 97 % 04/21/23 0939  Vitals shown include unfiled device data.  Last Pain:  Vitals:   04/21/23 0939  TempSrc:   PainSc: 0-No pain         Complications: No notable events documented.

## 2023-04-21 NOTE — Anesthesia Preprocedure Evaluation (Signed)
Anesthesia Evaluation  Patient identified by MRN, date of birth, ID band Patient awake    Reviewed: Allergy & Precautions, NPO status , Patient's Chart, lab work & pertinent test results  History of Anesthesia Complications Negative for: history of anesthetic complications  Airway Mallampati: II  TM Distance: >3 FB Neck ROM: Full    Dental no notable dental hx. (+) Teeth Intact   Pulmonary neg pulmonary ROS, neg sleep apnea, neg COPD, Patient abstained from smoking.Not current smoker, former smoker   Pulmonary exam normal breath sounds clear to auscultation       Cardiovascular Exercise Tolerance: Good METShypertension, Pt. on medications (-) CAD and (-) Past MI (-) dysrhythmias  Rhythm:Regular Rate:Normal - Systolic murmurs    Neuro/Psych neg Seizures negative neurological ROS  negative psych ROS   GI/Hepatic ,GERD  Medicated and Controlled,,(+)     (-) substance abuse    Endo/Other  diabetes, Well Controlled, Oral Hypoglycemic Agents  Class 3 obesity  Renal/GU negative Renal ROS     Musculoskeletal   Abdominal  (+) + obese  Peds  Hematology   Anesthesia Other Findings Past Medical History: No date: Allergy No date: Arthritis No date: Diabetes mellitus without complication (HCC) No date: Environmental allergies No date: Flatulence, eructation and gas pain No date: GERD (gastroesophageal reflux disease) No date: Hx of adenomatous colonic polyps No date: Hypercholesterolemia No date: Hypertension No date: Murmur No date: Rheumatoid arthritis (HCC) No date: Seizure (HCC)     Comment:  AS A CHILD No date: [place]  Reproductive/Obstetrics                             Anesthesia Physical Anesthesia Plan  ASA: 3  Anesthesia Plan: General   Post-op Pain Management: Minimal or no pain anticipated   Induction: Intravenous  PONV Risk Score and Plan: 3 and Propofol infusion, TIVA  and Ondansetron  Airway Management Planned: Nasal Cannula  Additional Equipment: None  Intra-op Plan:   Post-operative Plan:   Informed Consent: I have reviewed the patients History and Physical, chart, labs and discussed the procedure including the risks, benefits and alternatives for the proposed anesthesia with the patient or authorized representative who has indicated his/her understanding and acceptance.     Dental advisory given  Plan Discussed with: CRNA and Surgeon  Anesthesia Plan Comments: (Discussed risks of anesthesia with patient, including possibility of difficulty with spontaneous ventilation under anesthesia necessitating airway intervention, PONV, and rare risks such as cardiac or respiratory or neurological events, and allergic reactions. Discussed the role of CRNA in patient's perioperative care. Patient understands. Patient informed about increased incidence of above perioperative risk due to high BMI. Patient understands.  )       Anesthesia Quick Evaluation

## 2023-04-22 ENCOUNTER — Encounter: Payer: Self-pay | Admitting: Internal Medicine

## 2023-04-22 LAB — SURGICAL PATHOLOGY

## 2023-06-11 ENCOUNTER — Ambulatory Visit: Payer: 59 | Admitting: Internal Medicine

## 2023-06-12 ENCOUNTER — Other Ambulatory Visit: Payer: Self-pay | Admitting: Internal Medicine

## 2023-06-14 ENCOUNTER — Other Ambulatory Visit: Payer: Self-pay | Admitting: Internal Medicine

## 2023-06-15 ENCOUNTER — Ambulatory Visit: Payer: BC Managed Care – PPO | Admitting: Internal Medicine

## 2023-06-15 ENCOUNTER — Encounter: Payer: Self-pay | Admitting: Internal Medicine

## 2023-06-15 VITALS — BP 128/70 | HR 70 | Temp 98.0°F | Resp 16 | Ht 66.0 in | Wt 289.0 lb

## 2023-06-15 DIAGNOSIS — E7800 Pure hypercholesterolemia, unspecified: Secondary | ICD-10-CM | POA: Diagnosis not present

## 2023-06-15 DIAGNOSIS — E1165 Type 2 diabetes mellitus with hyperglycemia: Secondary | ICD-10-CM | POA: Diagnosis not present

## 2023-06-15 DIAGNOSIS — F439 Reaction to severe stress, unspecified: Secondary | ICD-10-CM

## 2023-06-15 DIAGNOSIS — Z7984 Long term (current) use of oral hypoglycemic drugs: Secondary | ICD-10-CM

## 2023-06-15 DIAGNOSIS — K219 Gastro-esophageal reflux disease without esophagitis: Secondary | ICD-10-CM

## 2023-06-15 DIAGNOSIS — I10 Essential (primary) hypertension: Secondary | ICD-10-CM

## 2023-06-15 DIAGNOSIS — Z860100 Personal history of colon polyps, unspecified: Secondary | ICD-10-CM

## 2023-06-15 NOTE — Progress Notes (Signed)
Subjective:    Patient ID: Wendy Callahan, female    DOB: 07-03-1960, 63 y.o.   MRN: [id number]  Patient here for  Chief Complaint  Patient presents with   Medical Management of Chronic Issues    HPI Here for a scheduled follow up - follow up regarding diabetes, hypertension and hypercholesterolemia. S/p colonoscopy 04/2023 - non bleeding internal hemorrhoids, one 8mm polyp in the rectum and one 11mm polyp in the ascending colon.  Discussed previously labs and A1c 7.9.  She is on metformin. Desired at that time not to add medication. Wanted to work on diet and exercise. Has adjusted diet. Weight is down. Due labs. Discussed treatment options. She declines medication at this time. Wants to continue her diet and exercise. No chest pain or sob reported. No abdominal pain or bowel change reported.    Past Medical History:  Diagnosis Date   Allergy    Arthritis    Diabetes mellitus without complication (HCC)    Environmental allergies    Flatulence, eructation and gas pain    GERD (gastroesophageal reflux disease)    Hx of adenomatous colonic polyps    Hypercholesterolemia    Hypertension    Murmur    Rheumatoid arthritis (HCC)    Seizure (HCC)    AS A CHILD   [place]    Past Surgical History:  Procedure Laterality Date   BLADDER SURGERY  1973   COLONOSCOPY  2014   Dr Mechele Collin   COLONOSCOPY WITH PROPOFOL N/A 04/21/2023   Procedure: COLONOSCOPY WITH PROPOFOL;  Surgeon: Toledo, Boykin Nearing, MD;  Location: [facility] ENDOSCOPY;  Service: Gastroenterology;  Laterality: N/A;   HEMOSTASIS CLIP PLACEMENT  04/21/2023   Procedure: HEMOSTASIS CLIP PLACEMENT;  Surgeon: Norma Fredrickson, Boykin Nearing, MD;  Location: [facility] ENDOSCOPY;  Service: Gastroenterology;;   HERNIA REPAIR     POLYPECTOMY  04/21/2023   Procedure: POLYPECTOMY;  Surgeon: Norma Fredrickson, Boykin Nearing, MD;  Location: [facility] ENDOSCOPY;  Service: Gastroenterology;;   TUBAL LIGATION  10/1991   UPPER GI ENDOSCOPY  2015   Dr Lorie Apley HERNIA REPAIR N/A  07/18/2018   10 x 12 cm Bard soft mesh retrorectus space HERNIA REPAIR VENTRAL ADULT WITH COMPONENT SEPERATION;  Surgeon: Earline Mayotte, MD;  Location: [facility] ORS;  Service: General;  Laterality: N/A;   Family History  Adopted: Yes  Problem Relation Age of Onset   Congenital heart disease Son        Tetrology of fallot   Social History   Socioeconomic History   Marital status: Married    Spouse name: Not on file   Number of children: 2   Years of education: Not on file   Highest education level: Not on file  Occupational History   Occupation: PROJECT ANALYST    Employer: [employer]    Comment: [employer] Information System  Tobacco Use   Smoking status: Former    Types: Cigarettes   Smokeless tobacco: Never  Vaping Use   Vaping status: Never Used  Substance and Sexual Activity   Alcohol use: Yes    Alcohol/week: 0.0 standard drinks of alcohol    Comment: occasional   Drug use: No   Sexual activity: Not on file  Other Topics Concern   Not on file  Social History Narrative   Not on file   Social Drivers of Health   Financial Resource Strain: Not on file  Food Insecurity: Not on file  Transportation Needs: Not on file  Physical Activity: Not on  file  Stress: Not on file  Social Connections: Not on file     Review of Systems  Constitutional:  Negative for appetite change and unexpected weight change.  HENT:  Negative for congestion and sinus pressure.   Respiratory:  Negative for cough, chest tightness and shortness of breath.   Cardiovascular:  Negative for chest pain, palpitations and leg swelling.  Gastrointestinal:  Negative for abdominal pain, diarrhea, nausea and vomiting.  Genitourinary:  Negative for difficulty urinating and dysuria.  Musculoskeletal:  Negative for joint swelling and myalgias.  Skin:  Negative for color change and rash.  Neurological:  Negative for dizziness and headaches.  Psychiatric/Behavioral:  Negative for agitation and dysphoric  mood.        Objective:     BP 128/70   Pulse 70   Temp 98 F (36.7 C)   Resp 16   Ht 5\' 6"  (1.676 m)   Wt 289 lb (131.1 kg)   LMP 05/18/2016   SpO2 98%   BMI 46.65 kg/m  Wt Readings from Last 3 Encounters:  06/15/23 289 lb (131.1 kg)  04/21/23 289 lb (131.1 kg)  01/21/23 298 lb 9.6 oz (135.4 kg)    Physical Exam Vitals reviewed.  Constitutional:      General: She is not in acute distress.    Appearance: Normal appearance.  HENT:     Head: Normocephalic and atraumatic.     Right Ear: External ear normal.     Left Ear: External ear normal.     Mouth/Throat:     Pharynx: No oropharyngeal exudate or posterior oropharyngeal erythema.  Eyes:     General: No scleral icterus.       Right eye: No discharge.        Left eye: No discharge.     Conjunctiva/sclera: Conjunctivae normal.  Neck:     Thyroid: No thyromegaly.  Cardiovascular:     Rate and Rhythm: Normal rate and regular rhythm.  Pulmonary:     Effort: No respiratory distress.     Breath sounds: Normal breath sounds. No wheezing.  Abdominal:     General: Bowel sounds are normal.     Palpations: Abdomen is soft.     Tenderness: There is no abdominal tenderness.  Musculoskeletal:        General: No swelling or tenderness.     Cervical back: Neck supple. No tenderness.  Lymphadenopathy:     Cervical: No cervical adenopathy.  Skin:    Findings: No erythema or rash.  Neurological:     Mental Status: She is alert.  Psychiatric:        Mood and Affect: Mood normal.        Behavior: Behavior normal.         Outpatient Encounter Medications as of 06/15/2023  Medication Sig   Acetaminophen (TYLENOL ARTHRITIS PAIN PO) Take 1,300 mg by mouth 2 (two) times daily.    acyclovir ointment (ZOVIRAX) 5 % Apply 1 application topically daily as needed (fever blisters). Apply topical as directed   albuterol (VENTOLIN HFA) 108 (90 Base) MCG/ACT inhaler Inhale 2 puffs into the lungs every 6 (six) hours as needed for  wheezing or shortness of breath.   cefdinir (OMNICEF) 300 MG capsule Take 1 capsule (300 mg total) by mouth 2 (two) times daily.   clotrimazole-betamethasone (LOTRISONE) cream Apply 1 application topically 2 (two) times daily as needed.   fexofenadine (ALLEGRA) 180 MG tablet Take 180 mg by mouth daily.   losartan (COZAAR) 100  MG tablet TAKE 1 TABLET BY MOUTH DAILY   mupirocin ointment (BACTROBAN) 2 % Apply to affected area bid   omeprazole (PRILOSEC) 20 MG capsule Take 20 mg by mouth every morning.    pimecrolimus (ELIDEL) 1 % cream Apply topically 2 (two) times daily as needed.   [DISCONTINUED] metFORMIN (GLUCOPHAGE) 1000 MG tablet TAKE ONE TABLET BY MOUTH TWICE A DAY WITH A MEAL   No facility-administered encounter medications on file as of 06/15/2023.     Lab Results  Component Value Date   WBC 7.0 11/10/2022   HGB 14.5 11/10/2022   HCT 44.5 11/10/2022   PLT 185 11/10/2022   GLUCOSE 140 (H) 06/15/2023   CHOL 263 (H) 06/15/2023   TRIG 411 (H) 06/15/2023   HDL 76 06/15/2023   LDLCALC 116 (H) 06/15/2023   ALT 29 06/15/2023   AST 32 06/15/2023   NA 141 06/15/2023   K 5.0 06/15/2023   CL 101 06/15/2023   CREATININE 0.58 06/15/2023   BUN 11 06/15/2023   CO2 23 06/15/2023   TSH 2.240 11/10/2022   HGBA1C 7.6 (H) 06/15/2023       Assessment & Plan:  Type 2 diabetes mellitus with hyperglycemia, without long-term current use of insulin (HCC) Assessment & Plan: On metformin.  Discussed diet and exercise. She has adjusted her diet. Lost weight. Discussed other treatment options. Desires not to add medication at this time.  Wants to continue diet and exercise. Follow met b and a1c. Check today.   Orders: -     Basic metabolic panel -     Hemoglobin A1c  Hypercholesterolemia Assessment & Plan: Low-cholesterol diet and exercise.  Have discussed calculated cholesterol results.  Desires not to take cholesterol medication. She has wanted to work on diet and exercise and hold on  medication.  Follow.   Orders: -     Hepatic function panel -     Lipid panel  Stress Assessment & Plan: Overall appears to be doing well.  Follow.    History of colonic polyps Assessment & Plan: Colonoscopy 07/2012.  Recommended f/u colonoscopy in 10 years. Colonoscopy 04/21/23 - Dr Norma Fredrickson.    Gastroesophageal reflux disease without esophagitis Assessment & Plan: No acid reflux symptoms.  Omeprazole.     Essential hypertension, benign Assessment & Plan: Blood pressure as outlined.  On losartan 100mg  q day.   Follow pressures.  Follow metabolic panel.        Dale Marengo, MD

## 2023-06-16 LAB — BASIC METABOLIC PANEL
BUN/Creatinine Ratio: 19 (ref 12–28)
BUN: 11 mg/dL (ref 8–27)
CO2: 23 mmol/L (ref 20–29)
Calcium: 9.3 mg/dL (ref 8.7–10.3)
Chloride: 101 mmol/L (ref 96–106)
Creatinine, Ser: 0.58 mg/dL (ref 0.57–1.00)
Glucose: 140 mg/dL — ABNORMAL HIGH (ref 70–99)
Potassium: 5 mmol/L (ref 3.5–5.2)
Sodium: 141 mmol/L (ref 134–144)
eGFR: 103 mL/min/{1.73_m2} (ref >59)

## 2023-06-16 LAB — LIPID PANEL
Chol/HDL Ratio: 3.5 {ratio} (ref 0.0–4.4)
Cholesterol, Total: 263 mg/dL — ABNORMAL HIGH (ref 100–199)
HDL: 76 mg/dL (ref >39)
LDL Chol Calc (NIH): 116 mg/dL — ABNORMAL HIGH (ref 0–99)
Triglycerides: 411 mg/dL — ABNORMAL HIGH (ref 0–149)
VLDL Cholesterol Cal: 71 mg/dL — ABNORMAL HIGH (ref 5–40)

## 2023-06-16 LAB — HEPATIC FUNCTION PANEL
ALT: 29 [IU]/L (ref 0–32)
AST: 32 [IU]/L (ref 0–40)
Albumin: 4.3 g/dL (ref 3.9–4.9)
Alkaline Phosphatase: 126 [IU]/L — ABNORMAL HIGH (ref 44–121)
Bilirubin Total: 0.2 mg/dL (ref 0.0–1.2)
Bilirubin, Direct: 0.12 mg/dL (ref 0.00–0.40)
Total Protein: 7 g/dL (ref 6.0–8.5)

## 2023-06-16 LAB — HEMOGLOBIN A1C
Est. average glucose Bld gHb Est-mCnc: 171 mg/dL
Hgb A1c MFr Bld: 7.6 % — ABNORMAL HIGH (ref 4.8–5.6)

## 2023-06-20 ENCOUNTER — Encounter: Payer: Self-pay | Admitting: Internal Medicine

## 2023-06-20 NOTE — Assessment & Plan Note (Signed)
No acid reflux symptoms.  Omeprazole.   °

## 2023-06-20 NOTE — Assessment & Plan Note (Signed)
Low-cholesterol diet and exercise.  Have discussed calculated cholesterol results.  Desires not to take cholesterol medication. She has wanted to work on diet and exercise and hold on medication.  Follow.

## 2023-06-20 NOTE — Assessment & Plan Note (Signed)
Colonoscopy 07/2012.  Recommended f/u colonoscopy in 10 years. Colonoscopy 04/21/23 - Dr Norma Fredrickson.

## 2023-06-20 NOTE — Assessment & Plan Note (Signed)
 Overall appears to be doing well.  Follow.

## 2023-06-20 NOTE — Assessment & Plan Note (Signed)
Blood pressure as outlined.  On losartan 100mg  q day.   Follow pressures.  Follow metabolic panel.

## 2023-06-20 NOTE — Assessment & Plan Note (Signed)
On metformin.  Discussed diet and exercise. She has adjusted her diet. Lost weight. Discussed other treatment options. Desires not to add medication at this time.  Wants to continue diet and exercise. Follow met b and a1c. Check today.

## 2023-06-28 ENCOUNTER — Telehealth: Payer: Self-pay

## 2023-06-28 DIAGNOSIS — R7989 Other specified abnormal findings of blood chemistry: Secondary | ICD-10-CM

## 2023-06-28 NOTE — Telephone Encounter (Signed)
 Lab orders placed per lab result message.

## 2023-07-13 ENCOUNTER — Other Ambulatory Visit: Payer: BLUE CROSS/BLUE SHIELD

## 2023-07-22 ENCOUNTER — Other Ambulatory Visit (INDEPENDENT_AMBULATORY_CARE_PROVIDER_SITE_OTHER): Payer: BLUE CROSS/BLUE SHIELD

## 2023-07-22 DIAGNOSIS — R7989 Other specified abnormal findings of blood chemistry: Secondary | ICD-10-CM | POA: Diagnosis not present

## 2023-07-22 NOTE — Addendum Note (Signed)
 Addended by: Warden Fillers on: 07/22/2023 11:16 AM   Modules accepted: Orders

## 2023-07-23 LAB — HEPATIC FUNCTION PANEL
ALT: 43 IU/L — ABNORMAL HIGH (ref 0–32)
AST: 53 IU/L — ABNORMAL HIGH (ref 0–40)
Albumin: 4.3 g/dL (ref 3.9–4.9)
Alkaline Phosphatase: 127 IU/L — ABNORMAL HIGH (ref 44–121)
Bilirubin Total: 0.4 mg/dL (ref 0.0–1.2)
Bilirubin, Direct: 0.15 mg/dL (ref 0.00–0.40)
Total Protein: 6.9 g/dL (ref 6.0–8.5)

## 2023-07-23 LAB — GAMMA GT: GGT: 191 IU/L — ABNORMAL HIGH (ref 0–60)

## 2023-09-13 ENCOUNTER — Encounter: Payer: Self-pay | Admitting: Internal Medicine

## 2023-09-13 ENCOUNTER — Ambulatory Visit: Payer: BLUE CROSS/BLUE SHIELD | Admitting: Internal Medicine

## 2023-09-13 ENCOUNTER — Other Ambulatory Visit (HOSPITAL_COMMUNITY)
Admission: RE | Admit: 2023-09-13 | Discharge: 2023-09-13 | Disposition: A | Discharge status: Home / Self Care | Source: Ambulatory Visit | Attending: Internal Medicine | Admitting: Internal Medicine

## 2023-09-13 VITALS — BP 128/72 | HR 70 | Temp 98.2°F | Resp 16 | Ht 66.0 in | Wt 289.0 lb

## 2023-09-13 DIAGNOSIS — Z124 Encounter for screening for malignant neoplasm of cervix: Secondary | ICD-10-CM | POA: Diagnosis not present

## 2023-09-13 DIAGNOSIS — I10 Essential (primary) hypertension: Secondary | ICD-10-CM

## 2023-09-13 DIAGNOSIS — E7800 Pure hypercholesterolemia, unspecified: Secondary | ICD-10-CM | POA: Diagnosis not present

## 2023-09-13 DIAGNOSIS — Z0000 Encounter for general adult medical examination without abnormal findings: Secondary | ICD-10-CM

## 2023-09-13 DIAGNOSIS — E1165 Type 2 diabetes mellitus with hyperglycemia: Secondary | ICD-10-CM

## 2023-09-13 DIAGNOSIS — K219 Gastro-esophageal reflux disease without esophagitis: Secondary | ICD-10-CM

## 2023-09-13 MED ORDER — METFORMIN HCL 1000 MG PO TABS: 1000.0000 mg | ORAL_TABLET | Freq: Two times a day (BID) | ORAL | 1 refills | Status: DC

## 2023-09-13 MED ORDER — LOSARTAN POTASSIUM 100 MG PO TABS: 100.0000 mg | ORAL_TABLET | Freq: Every day | ORAL | 1 refills | Status: DC

## 2023-09-13 NOTE — Progress Notes (Signed)
 Subjective:    Patient ID: Wendy Callahan, female    DOB: [birth year], 63 y.o.   MRN: [id number]  Patient here for  Chief Complaint  Patient presents with   Annual Exam    HPI Here for a physical exam. S/p colonoscopy 04/2023 - non bleeding internal hemorrhoids, one 8mm polyp in the rectum and one 11mm polyp in the ascending colon. Reports she is doing relatively well. No chest pain or sob reported. No cough or congestion. No abdominal pain or bowel change reported. Discussed labs.  Last A1c remaining elevated - 7.6. discussed medication adjustment/treatment changes. She desires not to add medication. Discussed taking current medication as ordered.    Past Medical History:  Diagnosis Date   Allergy    Arthritis    Diabetes mellitus without complication (HCC)    Environmental allergies    Flatulence, eructation and gas pain    GERD (gastroesophageal reflux disease)    Hx of adenomatous colonic polyps    Hypercholesterolemia    Hypertension    Murmur    Rheumatoid arthritis (HCC)    Seizure (HCC)    AS A CHILD   [place]    Past Surgical History:  Procedure Laterality Date   BLADDER SURGERY  1973   COLONOSCOPY  2014   Dr Felicita Horns   COLONOSCOPY WITH PROPOFOL  N/A 04/21/2023   Procedure: COLONOSCOPY WITH PROPOFOL ;  Surgeon: Toledo, Alphonsus Jeans, MD;  Location: [facility] ENDOSCOPY;  Service: Gastroenterology;  Laterality: N/A;   HEMOSTASIS CLIP PLACEMENT  04/21/2023   Procedure: HEMOSTASIS CLIP PLACEMENT;  Surgeon: Corky Diener, Alphonsus Jeans, MD;  Location: [facility] ENDOSCOPY;  Service: Gastroenterology;;   HERNIA REPAIR     POLYPECTOMY  04/21/2023   Procedure: POLYPECTOMY;  Surgeon: Corky Diener, Alphonsus Jeans, MD;  Location: [facility] ENDOSCOPY;  Service: Gastroenterology;;   TUBAL LIGATION  10/1991   UPPER GI ENDOSCOPY  2015   Dr Eilleen Grates HERNIA REPAIR N/A 07/18/2018   10 x 12 cm Bard soft mesh retrorectus space HERNIA REPAIR VENTRAL ADULT WITH COMPONENT SEPERATION;  Surgeon: Marshall Skeeter, MD;   Location: [facility] ORS;  Service: General;  Laterality: N/A;   Family History  Adopted: Yes  Problem Relation Age of Onset   Congenital heart disease Son        Tetrology of fallot   Social History   Socioeconomic History   Marital status: Married    Spouse name: Not on file   Number of children: 2   Years of education: Not on file   Highest education level: Not on file  Occupational History   Occupation: PROJECT ANALYST    Employer: [employer]    Comment: Business Information System  Tobacco Use   Smoking status: Former    Types: Cigarettes   Smokeless tobacco: Never  Vaping Use   Vaping status: Never Used  Substance and Sexual Activity   Alcohol use: Yes    Alcohol/week: 0.0 standard drinks of alcohol    Comment: occasional   Drug use: No   Sexual activity: Not on file  Other Topics Concern   Not on file  Social History Narrative   Not on file   Social Drivers of Health   Financial Resource Strain: Not on file  Food Insecurity: Not on file  Transportation Needs: Not on file  Physical Activity: Not on file  Stress: Not on file  Social Connections: Not on file     Review of Systems  Constitutional:  Negative for appetite change and unexpected  weight change.  HENT:  Negative for congestion, sinus pressure and sore throat.   Eyes:  Negative for pain and visual disturbance.  Respiratory:  Negative for cough, chest tightness and shortness of breath.   Cardiovascular:  Negative for chest pain and palpitations.       No increased swelling.   Gastrointestinal:  Negative for abdominal pain, diarrhea, nausea and vomiting.  Genitourinary:  Negative for difficulty urinating and dysuria.  Musculoskeletal:  Negative for joint swelling and myalgias.  Skin:  Negative for color change and rash.  Neurological:  Negative for dizziness and headaches.  Hematological:  Negative for adenopathy. Does not bruise/bleed easily.  Psychiatric/Behavioral:  Negative for decreased  concentration and dysphoric mood.        Objective:     BP 128/72   Pulse 70   Temp 98.2 F (36.8 C)   Resp 16   Ht 5\' 6"  (1.676 m)   Wt 289 lb (131.1 kg)   LMP 05/18/2016   SpO2 98%   BMI 46.65 kg/m  Wt Readings from Last 3 Encounters:  09/13/23 289 lb (131.1 kg)  06/15/23 289 lb (131.1 kg)  04/21/23 289 lb (131.1 kg)    Physical Exam Vitals reviewed.  Constitutional:      General: She is not in acute distress.    Appearance: Normal appearance. She is well-developed.  HENT:     Head: Normocephalic and atraumatic.     Right Ear: External ear normal.     Left Ear: External ear normal.     Mouth/Throat:     Pharynx: No oropharyngeal exudate or posterior oropharyngeal erythema.  Eyes:     General: No scleral icterus.       Right eye: No discharge.        Left eye: No discharge.     Conjunctiva/sclera: Conjunctivae normal.  Neck:     Thyroid: No thyromegaly.  Cardiovascular:     Rate and Rhythm: Normal rate and regular rhythm.  Pulmonary:     Effort: No tachypnea, accessory muscle usage or respiratory distress.     Breath sounds: Normal breath sounds. No decreased breath sounds or wheezing.  Chest:  Breasts:    Right: No inverted nipple, mass, nipple discharge or tenderness (no axillary adenopathy).     Left: No inverted nipple, mass, nipple discharge or tenderness (no axilarry adenopathy).  Abdominal:     General: Bowel sounds are normal.     Palpations: Abdomen is soft.     Tenderness: There is no abdominal tenderness.  Genitourinary:    Comments: Normal external genitalia.  Vaginal vault without lesions.  Cervix identified.  Pap smear performed.  Could not appreciate any adnexal masses or tenderness.   Musculoskeletal:        General: No swelling or tenderness.     Cervical back: Neck supple.  Lymphadenopathy:     Cervical: No cervical adenopathy.  Skin:    Findings: No erythema or rash.  Neurological:     Mental Status: She is alert and oriented to  person, place, and time.  Psychiatric:        Mood and Affect: Mood normal.        Behavior: Behavior normal.         Outpatient Encounter Medications as of 09/13/2023  Medication Sig   Acetaminophen  (TYLENOL  ARTHRITIS PAIN PO) Take 1,300 mg by mouth 2 (two) times daily.    acyclovir  ointment (ZOVIRAX ) 5 % Apply 1 application topically daily as needed (fever blisters). Apply  topical as directed   clotrimazole -betamethasone  (LOTRISONE ) cream Apply 1 application topically 2 (two) times daily as needed.   fexofenadine (ALLEGRA) 180 MG tablet Take 180 mg by mouth daily.   losartan  (COZAAR ) 100 MG tablet Take 1 tablet (100 mg total) by mouth daily.   metFORMIN  (GLUCOPHAGE ) 1000 MG tablet Take 1 tablet (1,000 mg total) by mouth 2 (two) times daily with a meal.   mupirocin  ointment (BACTROBAN ) 2 % Apply to affected area bid   omeprazole (PRILOSEC) 20 MG capsule Take 20 mg by mouth every morning.    pimecrolimus  (ELIDEL ) 1 % cream Apply topically 2 (two) times daily as needed.   [DISCONTINUED] albuterol  (VENTOLIN  HFA) 108 (90 Base) MCG/ACT inhaler Inhale 2 puffs into the lungs every 6 (six) hours as needed for wheezing or shortness of breath.   [DISCONTINUED] cefdinir  (OMNICEF ) 300 MG capsule Take 1 capsule (300 mg total) by mouth 2 (two) times daily.   [DISCONTINUED] losartan  (COZAAR ) 100 MG tablet TAKE 1 TABLET BY MOUTH DAILY   [DISCONTINUED] metFORMIN  (GLUCOPHAGE ) 1000 MG tablet TAKE 1 TABLET BY MOUTH TWICE A DAY WITH A MEAL   No facility-administered encounter medications on file as of 09/13/2023.     Lab Results  Component Value Date   WBC 7.0 11/10/2022   HGB 14.5 11/10/2022   HCT 44.5 11/10/2022   PLT 185 11/10/2022   GLUCOSE 140 (H) 06/15/2023   CHOL 263 (H) 06/15/2023   TRIG 411 (H) 06/15/2023   HDL 76 06/15/2023   LDLCALC 116 (H) 06/15/2023   ALT 43 (H) 07/22/2023   AST 53 (H) 07/22/2023   NA 141 06/15/2023   K 5.0 06/15/2023   CL 101 06/15/2023   CREATININE 0.58  06/15/2023   BUN 11 06/15/2023   CO2 23 06/15/2023   TSH 2.240 11/10/2022   HGBA1C 7.6 (H) 06/15/2023       Assessment & Plan:  Routine general medical examination at a health care facility  Hypercholesterolemia Assessment & Plan: Low-cholesterol diet and exercise.  Have discussed calculated cholesterol results.  Desires not to take cholesterol medication. She has wanted to work on diet and exercise and hold on medication.  Schedule f/u lipid panel.   Orders: -     TSH; Future -     CBC with Differential/Platelet; Future -     Hepatic function panel; Future -     Lipid panel; Future  Type 2 diabetes mellitus with hyperglycemia, without long-term current use of insulin (HCC) Assessment & Plan: Discussed diet and exercise. Discussed other treatment options. Desires not to add medication at this time.  Wants to continue diet and exercise.  Take metformin  as directed. Follow met b and A1c. Schedule fasting labs.   Orders: -     Hemoglobin A1c; Future -     Basic metabolic panel with GFR; Future  Health care maintenance Assessment & Plan: Physical today 09/13/23.  PAP 02/28/20 negative with negative HPV.  Repeat pap today. Colonoscopy 04/2023 - non bleeding internal hemorrhoids, one 8mm polyp in the rectum and one 11mm polyp in the ascending colon. Mammogram 02/11/23 - Birads I.    Screening for cervical cancer -     Cytology - PAP  Essential hypertension, benign Assessment & Plan: Continue losartan . Blood pressure as outlined. Follow pressures. Follow metabolic panel.    Gastroesophageal reflux disease without esophagitis Assessment & Plan: No upper symptoms reported. Continue omeprazole.    Other orders -     Losartan  Potassium; Take 1 tablet (100  mg total) by mouth daily.  Dispense: 90 tablet; Refill: 1 -     metFORMIN  HCl; Take 1 tablet (1,000 mg total) by mouth 2 (two) times daily with a meal.  Dispense: 180 tablet; Refill: 1     Dellar Fenton, MD

## 2023-09-13 NOTE — Assessment & Plan Note (Addendum)
 Physical today 09/13/23.  PAP 02/28/20 negative with negative HPV.  Repeat pap today. Colonoscopy 04/2023 - non bleeding internal hemorrhoids, one 8mm polyp in the rectum and one 11mm polyp in the ascending colon. Mammogram 02/11/23 - Birads I.

## 2023-09-15 ENCOUNTER — Encounter: Payer: Self-pay | Admitting: Internal Medicine

## 2023-09-15 LAB — CYTOLOGY - PAP
Comment: NEGATIVE
Diagnosis: NEGATIVE
High risk HPV: NEGATIVE

## 2023-09-19 ENCOUNTER — Encounter: Payer: Self-pay | Admitting: Internal Medicine

## 2023-09-19 NOTE — Assessment & Plan Note (Signed)
 Continue losartan.  Blood pressure as outlined.  Follow pressures.  Follow metabolic panel.

## 2023-09-19 NOTE — Assessment & Plan Note (Signed)
 Discussed diet and exercise. Discussed other treatment options. Desires not to add medication at this time.  Wants to continue diet and exercise.  Take metformin  as directed. Follow met b and A1c. Schedule fasting labs.

## 2023-09-19 NOTE — Assessment & Plan Note (Signed)
 Low-cholesterol diet and exercise.  Have discussed calculated cholesterol results.  Desires not to take cholesterol medication. She has wanted to work on diet and exercise and hold on medication.  Schedule f/u lipid panel.

## 2023-09-19 NOTE — Assessment & Plan Note (Signed)
No upper symptoms reported.  Continue omeprazole.

## 2023-09-23 ENCOUNTER — Other Ambulatory Visit (INDEPENDENT_AMBULATORY_CARE_PROVIDER_SITE_OTHER)

## 2023-09-23 DIAGNOSIS — E7800 Pure hypercholesterolemia, unspecified: Secondary | ICD-10-CM

## 2023-09-23 DIAGNOSIS — E1165 Type 2 diabetes mellitus with hyperglycemia: Secondary | ICD-10-CM | POA: Diagnosis not present

## 2023-09-24 LAB — BASIC METABOLIC PANEL WITH GFR
BUN/Creatinine Ratio: 20 (ref 12–28)
BUN: 12 mg/dL (ref 8–27)
CO2: 22 mmol/L (ref 20–29)
Calcium: 9.9 mg/dL (ref 8.7–10.3)
Chloride: 101 mmol/L (ref 96–106)
Creatinine, Ser: 0.6 mg/dL (ref 0.57–1.00)
Glucose: 202 mg/dL — ABNORMAL HIGH (ref 70–99)
Potassium: 4.7 mmol/L (ref 3.5–5.2)
Sodium: 138 mmol/L (ref 134–144)
eGFR: 102 mL/min/{1.73_m2} (ref >59)

## 2023-09-24 LAB — HEPATIC FUNCTION PANEL
ALT: 62 IU/L — ABNORMAL HIGH (ref 0–32)
AST: 77 IU/L — ABNORMAL HIGH (ref 0–40)
Albumin: 4.2 g/dL (ref 3.9–4.9)
Alkaline Phosphatase: 141 IU/L — ABNORMAL HIGH (ref 44–121)
Bilirubin Total: 0.5 mg/dL (ref 0.0–1.2)
Bilirubin, Direct: 0.2 mg/dL (ref 0.00–0.40)
Total Protein: 7.1 g/dL (ref 6.0–8.5)

## 2023-09-24 LAB — CBC WITH DIFFERENTIAL/PLATELET
Basophils Absolute: 0 10*3/uL (ref 0.0–0.2)
Basos: 1 %
EOS (ABSOLUTE): 0.2 10*3/uL (ref 0.0–0.4)
Eos: 3 %
Hematocrit: 45.3 % (ref 34.0–46.6)
Hemoglobin: 15.2 g/dL (ref 11.1–15.9)
Immature Grans (Abs): 0 10*3/uL (ref 0.0–0.1)
Immature Granulocytes: 0 %
Lymphocytes Absolute: 1.6 10*3/uL (ref 0.7–3.1)
Lymphs: 21 %
MCH: 31.8 pg (ref 26.6–33.0)
MCHC: 33.6 g/dL (ref 31.5–35.7)
MCV: 95 fL (ref 79–97)
Monocytes Absolute: 0.5 10*3/uL (ref 0.1–0.9)
Monocytes: 6 %
Neutrophils Absolute: 5.3 10*3/uL (ref 1.4–7.0)
Neutrophils: 69 %
Platelets: 198 10*3/uL (ref 150–450)
RBC: 4.78 x10E6/uL (ref 3.77–5.28)
RDW: 11.5 % — ABNORMAL LOW (ref 11.7–15.4)
WBC: 7.7 10*3/uL (ref 3.4–10.8)

## 2023-09-24 LAB — TSH: TSH: 2.32 u[IU]/mL (ref 0.450–4.500)

## 2023-09-24 LAB — LIPID PANEL
Chol/HDL Ratio: 3.6 ratio (ref 0.0–4.4)
Cholesterol, Total: 244 mg/dL — ABNORMAL HIGH (ref 100–199)
HDL: 68 mg/dL (ref >39)
LDL Chol Calc (NIH): 135 mg/dL — ABNORMAL HIGH (ref 0–99)
Triglycerides: 231 mg/dL — ABNORMAL HIGH (ref 0–149)
VLDL Cholesterol Cal: 41 mg/dL — ABNORMAL HIGH (ref 5–40)

## 2023-09-24 LAB — HEMOGLOBIN A1C
Est. average glucose Bld gHb Est-mCnc: 203 mg/dL
Hgb A1c MFr Bld: 8.7 % — ABNORMAL HIGH (ref 4.8–5.6)

## 2023-09-28 ENCOUNTER — Ambulatory Visit: Payer: Self-pay

## 2023-09-28 NOTE — Telephone Encounter (Signed)
 Left message to call the office back regarding the lab results below. Okay to give the lab results when the Patient calls back. Please see if the Patient would like to keep her 12/13/23 or 12/14/23 appointment for a 3 month follow up then cancel the other one.

## 2023-09-28 NOTE — Telephone Encounter (Signed)
-----   Message from [facility] sent at 09/27/2023  3:37 AM EDT ----- Notify - A1c elevated. Increased from previous checks. She has declined additional medication previously. Need to schedule an appt to discuss treatment. Cholesterol remains elevated. Liver enzymes remain elevated and are increasing. Please schedule an appt to discuss .

## 2023-10-05 NOTE — Telephone Encounter (Signed)
 Left message to call the office back regarding lab results. Okay to give the lab results below.

## 2023-10-05 NOTE — Telephone Encounter (Signed)
-----   Message from [facility] sent at 09/27/2023  3:37 AM EDT ----- Notify - A1c elevated. Increased from previous checks. She has declined additional medication previously. Need to schedule an appt to discuss treatment. Cholesterol remains elevated. Liver enzymes remain elevated and are increasing. Please schedule an appt to discuss .

## 2023-12-13 ENCOUNTER — Ambulatory Visit: Admitting: Internal Medicine

## 2023-12-14 ENCOUNTER — Ambulatory Visit: Admitting: Internal Medicine

## 2024-01-12 ENCOUNTER — Other Ambulatory Visit: Payer: Self-pay | Admitting: Internal Medicine

## 2024-02-22 ENCOUNTER — Ambulatory Visit: Admitting: Internal Medicine

## 2024-02-22 VITALS — BP 124/72 | HR 72 | Ht 66.0 in | Wt 270.0 lb

## 2024-02-22 DIAGNOSIS — E1165 Type 2 diabetes mellitus with hyperglycemia: Secondary | ICD-10-CM

## 2024-02-22 DIAGNOSIS — E7800 Pure hypercholesterolemia, unspecified: Secondary | ICD-10-CM | POA: Diagnosis not present

## 2024-02-22 DIAGNOSIS — K219 Gastro-esophageal reflux disease without esophagitis: Secondary | ICD-10-CM

## 2024-02-22 DIAGNOSIS — I10 Essential (primary) hypertension: Secondary | ICD-10-CM

## 2024-02-22 DIAGNOSIS — Z860100 Personal history of colon polyps, unspecified: Secondary | ICD-10-CM

## 2024-02-22 DIAGNOSIS — Z23 Encounter for immunization: Secondary | ICD-10-CM

## 2024-02-22 DIAGNOSIS — Z7984 Long term (current) use of oral hypoglycemic drugs: Secondary | ICD-10-CM | POA: Diagnosis not present

## 2024-02-22 DIAGNOSIS — Z1231 Encounter for screening mammogram for malignant neoplasm of breast: Secondary | ICD-10-CM

## 2024-02-22 DIAGNOSIS — F439 Reaction to severe stress, unspecified: Secondary | ICD-10-CM

## 2024-02-22 LAB — HM DIABETES FOOT EXAM

## 2024-02-22 NOTE — Progress Notes (Signed)
 Subjective:    Patient ID: Wendy Callahan, female    DOB: 23-Sep-1960, 63 y.o.   MRN: [id number]  Patient here for  Chief Complaint  Patient presents with   Medical Management of Chronic Issues    HPI Here for a scheduled follow up - follow up regarding diabetes, hypertension and hypercholesterolemia. Sugar has not been controlled. She has declined additional medication previously. Last A1c 8.7. she has adjusted her diet. Is walking 4-5 days/week. Has lost weight. Had flare with sciatica - two weeks. Is better. No chest pain or sob reported. No cough or congestion. No abdominal pain or bowel change reported. She has started supplements - super beets, tumeric, cinnamon sticks and lemon juice. S/p colonoscopy 04/2023 - non bleeding internal hemorrhoids, one 8mm polyp in the rectum and one 11mm polyp in the ascending colon. Handling stress.    Past Medical History:  Diagnosis Date   Allergy    Arthritis    Diabetes mellitus without complication (HCC)    Environmental allergies    Flatulence, eructation and gas pain    GERD (gastroesophageal reflux disease)    Hx of adenomatous colonic polyps    Hypercholesterolemia    Hypertension    Murmur    Rheumatoid arthritis (HCC)    Seizure (HCC)    AS A CHILD   [place]    Past Surgical History:  Procedure Laterality Date   BLADDER SURGERY  1973   COLONOSCOPY  2014   Dr Viktoria   COLONOSCOPY WITH PROPOFOL  N/A 04/21/2023   Procedure: COLONOSCOPY WITH PROPOFOL ;  Surgeon: Toledo, Ladell POUR, MD;  Location: [facility] ENDOSCOPY;  Service: Gastroenterology;  Laterality: N/A;   HEMOSTASIS CLIP PLACEMENT  04/21/2023   Procedure: HEMOSTASIS CLIP PLACEMENT;  Surgeon: Aundria, Ladell POUR, MD;  Location: [facility] ENDOSCOPY;  Service: Gastroenterology;;   HERNIA REPAIR     POLYPECTOMY  04/21/2023   Procedure: POLYPECTOMY;  Surgeon: Aundria, Ladell POUR, MD;  Location: [facility] ENDOSCOPY;  Service: Gastroenterology;;   TUBAL LIGATION  10/1991   UPPER GI ENDOSCOPY  2015    Dr Viktoria PARKER HERNIA REPAIR N/A 07/18/2018   10 x 12 cm Bard soft mesh retrorectus space HERNIA REPAIR VENTRAL ADULT WITH COMPONENT SEPERATION;  Surgeon: Dessa Reyes ORN, MD;  Location: [facility] ORS;  Service: General;  Laterality: N/A;   Family History  Adopted: Yes  Problem Relation Age of Onset   Congenital heart disease Son        Tetrology of fallot   Social History   Socioeconomic History   Marital status: Married    Spouse name: Not on file   Number of children: 2   Years of education: Not on file   Highest education level: Not on file  Occupational History   Occupation: PROJECT ANALYST    Employer: [employer]    Comment: Business Information System  Tobacco Use   Smoking status: Former    Types: Cigarettes   Smokeless tobacco: Never  Vaping Use   Vaping status: Never Used  Substance and Sexual Activity   Alcohol use: Yes    Alcohol/week: 0.0 standard drinks of alcohol    Comment: occasional   Drug use: No   Sexual activity: Not on file  Other Topics Concern   Not on file  Social History Narrative   Not on file   Social Drivers of Health   Financial Resource Strain: Not on file  Food Insecurity: Not on file  Transportation Needs: Not on file  Physical  Activity: Not on file  Stress: Not on file  Social Connections: Not on file     Review of Systems  Constitutional:  Negative for appetite change and unexpected weight change.  HENT:  Negative for congestion and sinus pressure.   Respiratory:  Negative for cough, chest tightness and shortness of breath.   Cardiovascular:  Negative for chest pain, palpitations and leg swelling.  Gastrointestinal:  Negative for abdominal pain, diarrhea, nausea and vomiting.  Genitourinary:  Negative for difficulty urinating and dysuria.  Musculoskeletal:  Negative for joint swelling and myalgias.  Skin:  Negative for color change and rash.  Neurological:  Negative for dizziness and headaches.   Psychiatric/Behavioral:  Negative for agitation and dysphoric mood.        Objective:     BP 124/72   Pulse 72   Ht 5' 6 (1.676 m)   Wt 270 lb (122.5 kg)   LMP 05/18/2016   SpO2 98%   BMI 43.58 kg/m  Wt Readings from Last 3 Encounters:  02/27/24 270 lb (122.5 kg)  09/13/23 289 lb (131.1 kg)  06/15/23 289 lb (131.1 kg)    Physical Exam Vitals reviewed.  Constitutional:      General: She is not in acute distress.    Appearance: Normal appearance.  HENT:     Head: Normocephalic and atraumatic.     Right Ear: External ear normal.     Left Ear: External ear normal.     Mouth/Throat:     Pharynx: No oropharyngeal exudate or posterior oropharyngeal erythema.  Eyes:     General: No scleral icterus.       Right eye: No discharge.        Left eye: No discharge.     Conjunctiva/sclera: Conjunctivae normal.  Neck:     Thyroid: No thyromegaly.  Cardiovascular:     Rate and Rhythm: Normal rate and regular rhythm.  Pulmonary:     Effort: No respiratory distress.     Breath sounds: Normal breath sounds. No wheezing.  Abdominal:     General: Bowel sounds are normal.     Palpations: Abdomen is soft.     Tenderness: There is no abdominal tenderness.  Musculoskeletal:        General: No swelling or tenderness.     Cervical back: Neck supple. No tenderness.  Lymphadenopathy:     Cervical: No cervical adenopathy.  Skin:    Findings: No erythema or rash.  Neurological:     Mental Status: She is alert.  Psychiatric:        Mood and Affect: Mood normal.        Behavior: Behavior normal.         Outpatient Encounter Medications as of 02/22/2024  Medication Sig   Acetaminophen  (TYLENOL  ARTHRITIS PAIN PO) Take 1,300 mg by mouth 2 (two) times daily.    acyclovir  ointment (ZOVIRAX ) 5 % Apply 1 application topically daily as needed (fever blisters). Apply topical as directed   clotrimazole -betamethasone  (LOTRISONE ) cream Apply 1 application topically 2 (two) times daily as  needed.   fexofenadine (ALLEGRA) 180 MG tablet Take 180 mg by mouth daily.   losartan  (COZAAR ) 100 MG tablet Take 1 tablet (100 mg total) by mouth daily.   metFORMIN  (GLUCOPHAGE ) 1000 MG tablet TAKE 1 TABLET BY MOUTH 2 TIMES A DAY WITH A MEAL   mupirocin  ointment (BACTROBAN ) 2 % Apply to affected area bid   omeprazole (PRILOSEC) 20 MG capsule Take 20 mg by mouth every morning.  pimecrolimus  (ELIDEL ) 1 % cream Apply topically 2 (two) times daily as needed.   No facility-administered encounter medications on file as of 02/22/2024.     Lab Results  Component Value Date   WBC 7.7 09/23/2023   HGB 15.2 09/23/2023   HCT 45.3 09/23/2023   PLT 198 09/23/2023   GLUCOSE 202 (H) 09/23/2023   CHOL 244 (H) 09/23/2023   TRIG 231 (H) 09/23/2023   HDL 68 09/23/2023   LDLCALC 135 (H) 09/23/2023   ALT 62 (H) 09/23/2023   AST 77 (H) 09/23/2023   NA 138 09/23/2023   K 4.7 09/23/2023   CL 101 09/23/2023   CREATININE 0.60 09/23/2023   BUN 12 09/23/2023   CO2 22 09/23/2023   TSH 2.320 09/23/2023   HGBA1C 8.7 (H) 09/23/2023       Assessment & Plan:  Hypercholesterolemia Assessment & Plan: Low-cholesterol diet and exercise.  Have discussed calculated cholesterol results.  Desires not to take cholesterol medication. She has wanted to work on diet and exercise and hold on medication.  Has adjusted her diet. Has lost weight. Check lipid panel with next fasting labs.   Orders: -     Lipid panel; Future -     Hepatic function panel; Future  Type 2 diabetes mellitus with hyperglycemia, without long-term current use of insulin (HCC) Assessment & Plan: She has adjusted her diet. Has lost weight. Is exercising. Taking metformin . Has declined other medications. Last A1c increased - 8.7. agreeable to follow up labs. Wanted to hold on checking today. Schedule met b and A1c.   Orders: -     Basic metabolic panel with GFR; Future -     Microalbumin / creatinine urine ratio; Future -     Hemoglobin A1c;  Future  Essential hypertension, benign Assessment & Plan: Continue losartan . Follow pressures. Follow metabolic panel. No change today.    Immunization due -     Flu vaccine trivalent PF, 6mos and older(Flulaval,Afluria,Fluarix,Fluzone)  Visit for screening mammogram -     3D Screening Mammogram, Left and Right; Future  Gastroesophageal reflux disease without esophagitis Assessment & Plan: Continues on omeprazole. No upper symptoms reported.    History of colonic polyps Assessment & Plan: Colonoscopy 07/2012.  Recommended f/u colonoscopy in 10 years. Colonoscopy 04/21/23 - Dr Aundria.    Stress Assessment & Plan: Overall appears to be handling things well. Follow.       Allena Hamilton, MD

## 2024-02-27 ENCOUNTER — Encounter: Payer: Self-pay | Admitting: Internal Medicine

## 2024-02-27 NOTE — Assessment & Plan Note (Signed)
 Colonoscopy 07/2012.  Recommended f/u colonoscopy in 10 years. Colonoscopy 04/21/23 - Dr Norma Fredrickson.

## 2024-02-27 NOTE — Assessment & Plan Note (Signed)
 Low-cholesterol diet and exercise.  Have discussed calculated cholesterol results.  Desires not to take cholesterol medication. She has wanted to work on diet and exercise and hold on medication.  Has adjusted her diet. Has lost weight. Check lipid panel with next fasting labs.

## 2024-02-27 NOTE — Assessment & Plan Note (Signed)
 Overall appears to be handling things well.  Follow.  ?

## 2024-02-27 NOTE — Assessment & Plan Note (Signed)
 She has adjusted her diet. Has lost weight. Is exercising. Taking metformin . Has declined other medications. Last A1c increased - 8.7. agreeable to follow up labs. Wanted to hold on checking today. Schedule met b and A1c.

## 2024-02-27 NOTE — Assessment & Plan Note (Signed)
 Continue losartan . Follow pressures. Follow metabolic panel. No change today.

## 2024-02-27 NOTE — Assessment & Plan Note (Signed)
 Continues on omeprazole . No upper symptoms reported.

## 2024-03-07 ENCOUNTER — Other Ambulatory Visit

## 2024-03-09 ENCOUNTER — Other Ambulatory Visit

## 2024-03-09 DIAGNOSIS — E1165 Type 2 diabetes mellitus with hyperglycemia: Secondary | ICD-10-CM | POA: Diagnosis not present

## 2024-03-09 DIAGNOSIS — E7800 Pure hypercholesterolemia, unspecified: Secondary | ICD-10-CM | POA: Diagnosis not present

## 2024-03-10 LAB — HEPATIC FUNCTION PANEL
ALT: 14 IU/L (ref 0–32)
AST: 15 IU/L (ref 0–40)
Albumin: 4.4 g/dL (ref 3.9–4.9)
Alkaline Phosphatase: 101 IU/L (ref 49–135)
Bilirubin Total: 0.5 mg/dL (ref 0.0–1.2)
Bilirubin, Direct: 0.16 mg/dL (ref 0.00–0.40)
Total Protein: 6.6 g/dL (ref 6.0–8.5)

## 2024-03-10 LAB — BASIC METABOLIC PANEL WITH GFR
BUN/Creatinine Ratio: 21 (ref 12–28)
BUN: 12 mg/dL (ref 8–27)
CO2: 24 mmol/L (ref 20–29)
Calcium: 9.7 mg/dL (ref 8.7–10.3)
Chloride: 102 mmol/L (ref 96–106)
Creatinine, Ser: 0.56 mg/dL — ABNORMAL LOW (ref 0.57–1.00)
Glucose: 155 mg/dL — ABNORMAL HIGH (ref 70–99)
Potassium: 5 mmol/L (ref 3.5–5.2)
Sodium: 142 mmol/L (ref 134–144)
eGFR: 104 mL/min/1.73 (ref >59)

## 2024-03-10 LAB — LIPID PANEL
Chol/HDL Ratio: 3.3 ratio (ref 0.0–4.4)
Cholesterol, Total: 241 mg/dL — ABNORMAL HIGH (ref 100–199)
HDL: 74 mg/dL (ref >39)
LDL Chol Calc (NIH): 136 mg/dL — ABNORMAL HIGH (ref 0–99)
Triglycerides: 178 mg/dL — ABNORMAL HIGH (ref 0–149)
VLDL Cholesterol Cal: 31 mg/dL (ref 5–40)

## 2024-03-10 LAB — MICROALBUMIN / CREATININE URINE RATIO
Creatinine, Urine: 84.5 mg/dL
Microalb/Creat Ratio: 24 mg/g{creat} (ref 0–29)
Microalbumin, Urine: 20.2 ug/mL

## 2024-03-10 LAB — HEMOGLOBIN A1C
Est. average glucose Bld gHb Est-mCnc: 148 mg/dL
Hgb A1c MFr Bld: 6.8 % — ABNORMAL HIGH (ref 4.8–5.6)

## 2024-03-14 ENCOUNTER — Other Ambulatory Visit: Payer: Self-pay | Admitting: Internal Medicine

## 2024-03-14 ENCOUNTER — Ambulatory Visit: Payer: Self-pay | Admitting: Internal Medicine

## 2024-06-16 ENCOUNTER — Encounter: Payer: Self-pay | Admitting: Internal Medicine

## 2024-06-16 MED ORDER — ACYCLOVIR 5 % EX OINT: 1.0000 | TOPICAL_OINTMENT | Freq: Every day | CUTANEOUS | 0 refills | Status: AC | PRN

## 2024-06-16 MED ORDER — PIMECROLIMUS 1 % EX CREA: TOPICAL_CREAM | Freq: Two times a day (BID) | CUTANEOUS | 0 refills | Status: AC | PRN

## 2024-06-16 NOTE — Telephone Encounter (Signed)
 Rx sent in for elidel  and zovirax .

## 2024-06-22 ENCOUNTER — Other Ambulatory Visit (HOSPITAL_COMMUNITY): Payer: Self-pay

## 2024-06-22 ENCOUNTER — Telehealth: Payer: Self-pay

## 2024-06-22 NOTE — Telephone Encounter (Signed)
 Pharmacy Patient Advocate Encounter   Received notification from [facility] that prior authorization for pimecrolimus  (ELIDEL ) 1 % cream  is required/requested.   Insurance verification completed.   The patient is insured through [facility].   Per test claim: PA required; PA submitted to above mentioned insurance via Latent Key/confirmation #/EOC [facility] Status is pending

## 2024-06-27 ENCOUNTER — Ambulatory Visit: Admitting: Internal Medicine
